# Patient Record
Sex: Male | Born: 1955 | Race: White | Hispanic: No | Marital: Married | Smoking: Former smoker
Health system: Southern US, Community
[De-identification: ages and names within clinical notes are randomized; demographics above are authoritative.]

## PROBLEM LIST (undated history)

## (undated) DIAGNOSIS — G43909 Migraine, unspecified, not intractable, without status migrainosus: Secondary | ICD-10-CM

## (undated) DIAGNOSIS — F32A Depression, unspecified: Secondary | ICD-10-CM

## (undated) DIAGNOSIS — J309 Allergic rhinitis, unspecified: Secondary | ICD-10-CM

## (undated) DIAGNOSIS — F419 Anxiety disorder, unspecified: Secondary | ICD-10-CM

## (undated) DIAGNOSIS — M25511 Pain in right shoulder: Secondary | ICD-10-CM

## (undated) DIAGNOSIS — IMO0002 Reserved for concepts with insufficient information to code with codable children: Secondary | ICD-10-CM

## (undated) DIAGNOSIS — I10 Essential (primary) hypertension: Secondary | ICD-10-CM

## (undated) DIAGNOSIS — G4730 Sleep apnea, unspecified: Secondary | ICD-10-CM

## (undated) DIAGNOSIS — E1165 Type 2 diabetes mellitus with hyperglycemia: Secondary | ICD-10-CM

## (undated) DIAGNOSIS — E7800 Pure hypercholesterolemia, unspecified: Secondary | ICD-10-CM

## (undated) DIAGNOSIS — G8929 Other chronic pain: Secondary | ICD-10-CM

## (undated) DIAGNOSIS — F329 Major depressive disorder, single episode, unspecified: Secondary | ICD-10-CM

## (undated) HISTORY — DX: Reserved for concepts with insufficient information to code with codable children: IMO0002

## (undated) HISTORY — PX: ROTATOR CUFF REPAIR: SHX139

## (undated) HISTORY — DX: Anxiety disorder, unspecified: F41.9

## (undated) HISTORY — DX: Morbid (severe) obesity due to excess calories: E66.01

## (undated) HISTORY — DX: Depression, unspecified: F32.A

## (undated) HISTORY — DX: Essential (primary) hypertension: I10

## (undated) HISTORY — DX: Type 2 diabetes mellitus with hyperglycemia: E11.65

## (undated) HISTORY — PX: APPENDECTOMY: SHX54

## (undated) HISTORY — DX: Pure hypercholesterolemia, unspecified: E78.00

## (undated) HISTORY — DX: Allergic rhinitis, unspecified: J30.9

## (undated) HISTORY — PX: TUMOR REMOVAL: SHX12

## (undated) HISTORY — DX: Other chronic pain: G89.29

## (undated) HISTORY — DX: Sleep apnea, unspecified: G47.30

## (undated) HISTORY — DX: Major depressive disorder, single episode, unspecified: F32.9

## (undated) HISTORY — DX: Pain in right shoulder: M25.511

## (undated) HISTORY — DX: Migraine, unspecified, not intractable, without status migrainosus: G43.909

---

## 2000-03-15 ENCOUNTER — Ambulatory Visit (HOSPITAL_COMMUNITY): Admission: RE | Admit: 2000-03-15 | Discharge: 2000-03-15 | Payer: Self-pay | Admitting: Internal Medicine

## 2000-10-23 ENCOUNTER — Emergency Department (HOSPITAL_COMMUNITY): Admission: EM | Admit: 2000-10-23 | Discharge: 2000-10-23 | Payer: Self-pay | Admitting: *Deleted

## 2000-10-23 ENCOUNTER — Encounter: Payer: Self-pay | Admitting: Emergency Medicine

## 2000-11-28 ENCOUNTER — Encounter: Admission: RE | Admit: 2000-11-28 | Discharge: 2000-11-28 | Payer: Self-pay | Admitting: Internal Medicine

## 2000-11-28 ENCOUNTER — Encounter: Payer: Self-pay | Admitting: Internal Medicine

## 2002-06-11 ENCOUNTER — Emergency Department (HOSPITAL_COMMUNITY): Admission: EM | Admit: 2002-06-11 | Discharge: 2002-06-12 | Payer: Self-pay | Admitting: Emergency Medicine

## 2003-07-29 ENCOUNTER — Ambulatory Visit (HOSPITAL_BASED_OUTPATIENT_CLINIC_OR_DEPARTMENT_OTHER): Admission: RE | Admit: 2003-07-29 | Discharge: 2003-07-29 | Payer: Self-pay | Admitting: Internal Medicine

## 2004-10-28 ENCOUNTER — Ambulatory Visit (HOSPITAL_COMMUNITY): Admission: RE | Admit: 2004-10-28 | Discharge: 2004-10-28 | Payer: Self-pay | Admitting: Internal Medicine

## 2004-11-18 ENCOUNTER — Ambulatory Visit (HOSPITAL_COMMUNITY): Admission: RE | Admit: 2004-11-18 | Discharge: 2004-11-19 | Payer: Self-pay | Admitting: Geriatric Medicine

## 2005-12-17 ENCOUNTER — Encounter: Admission: RE | Admit: 2005-12-17 | Discharge: 2005-12-17 | Payer: Self-pay | Admitting: Orthopedic Surgery

## 2006-01-18 ENCOUNTER — Ambulatory Visit (HOSPITAL_COMMUNITY): Admission: RE | Admit: 2006-01-18 | Discharge: 2006-01-19 | Payer: Self-pay | Admitting: Orthopedic Surgery

## 2011-07-21 ENCOUNTER — Other Ambulatory Visit: Payer: Self-pay | Admitting: Physician Assistant

## 2011-07-21 ENCOUNTER — Ambulatory Visit
Admission: RE | Admit: 2011-07-21 | Discharge: 2011-07-21 | Disposition: A | Discharge status: Home / Self Care | Payer: BC Managed Care – PPO | Source: Ambulatory Visit | Attending: Physician Assistant | Admitting: Physician Assistant

## 2011-07-21 DIAGNOSIS — R509 Fever, unspecified: Secondary | ICD-10-CM

## 2012-02-18 ENCOUNTER — Other Ambulatory Visit: Payer: Self-pay | Admitting: Family Medicine

## 2012-02-18 ENCOUNTER — Ambulatory Visit
Admission: RE | Admit: 2012-02-18 | Discharge: 2012-02-18 | Disposition: A | Discharge status: Home / Self Care | Payer: BC Managed Care – PPO | Source: Ambulatory Visit | Attending: Family Medicine | Admitting: Family Medicine

## 2012-02-18 DIAGNOSIS — M069 Rheumatoid arthritis, unspecified: Secondary | ICD-10-CM

## 2012-02-18 DIAGNOSIS — R52 Pain, unspecified: Secondary | ICD-10-CM

## 2012-02-18 DIAGNOSIS — D464 Refractory anemia, unspecified: Secondary | ICD-10-CM

## 2014-08-12 ENCOUNTER — Ambulatory Visit: Payer: BC Managed Care – PPO | Admitting: Endocrinology

## 2014-08-12 ENCOUNTER — Encounter: Payer: Self-pay | Admitting: *Deleted

## 2016-11-15 ENCOUNTER — Encounter: Payer: Self-pay | Admitting: Endocrinology

## 2017-12-30 ENCOUNTER — Encounter: Payer: Self-pay | Admitting: Endocrinology

## 2017-12-30 ENCOUNTER — Ambulatory Visit: Payer: BC Managed Care – PPO | Admitting: Endocrinology

## 2017-12-30 VITALS — BP 160/100 | HR 97 | Ht 65.0 in | Wt 287.0 lb

## 2017-12-30 DIAGNOSIS — E1165 Type 2 diabetes mellitus with hyperglycemia: Secondary | ICD-10-CM

## 2017-12-30 LAB — POCT GLYCOSYLATED HEMOGLOBIN (HGB A1C): Hemoglobin A1C: 8.9 % — AB (ref 4.0–5.6)

## 2017-12-30 MED ORDER — SITAGLIPTIN PHOSPHATE 100 MG PO TABS: 100.0000 mg | ORAL_TABLET | Freq: Every day | ORAL | 11 refills | Status: DC

## 2017-12-30 MED ORDER — INSULIN DEGLUDEC 200 UNIT/ML ~~LOC~~ SOPN: 180.0000 [IU] | PEN_INJECTOR | Freq: Every day | SUBCUTANEOUS | 11 refills | Status: DC

## 2017-12-30 NOTE — Progress Notes (Signed)
Subjective:    Patient ID: Brian Sweeney, male    DOB: 02-08-1956, 62 y.o.   MRN: [id number]  HPI pt is referred by Dr Jerrol Banana diabetes.  Pt states DM was dx'ed in 2015; he has mild neuropathy of the lower extremities, and associated PDR; he has been on insulin since late 2018; pt says his diet and exercise are poor; he has never had pancreatitis, pancreatic surgery, severe hypoglycemia or DKA.  He takes tresiba, 220 units/d.  He says cbg's vary from 70-280.   Meter is downloaded today, and the printout is scanned into the record.  It varies from 90-280.  There is no trend throughout the day. Past Medical History:  Diagnosis Date  . Allergic rhinitis   . Anxiety   . Chronic right shoulder pain   . Depression   . Hypercholesterolemia   . Hypertension   . Migraine   . Obesity, morbid ([place])   . Sleep apnea   . Type II diabetes mellitus, uncontrolled ([place])     Past Surgical History:  Procedure Laterality Date  . APPENDECTOMY    . ROTATOR CUFF REPAIR Right   . TUMOR REMOVAL      Social History   Socioeconomic History  . Marital status: Married    Spouse name: Not on file  . Number of children: Not on file  . Years of education: Not on file  . Highest education level: Not on file  Occupational History  . Not on file  Social Needs  . Financial resource strain: Not on file  . Food insecurity:    Worry: Not on file    Inability: Not on file  . Transportation needs:    Medical: Not on file    Non-medical: Not on file  Tobacco Use  . Smoking status: Former Research scientist (life sciences)  . Smokeless tobacco: Never Used  Substance and Sexual Activity  . Alcohol use: Not Currently    Frequency: Never  . Drug use: Not Currently  . Sexual activity: Not on file  Lifestyle  . Physical activity:    Days per week: Not on file    Minutes per session: Not on file  . Stress: Not on file  Relationships  . Social connections:    Talks on phone: Not on file    Gets together: Not on file    Attends  religious service: Not on file    Active member of club or organization: Not on file    Attends meetings of clubs or organizations: Not on file    Relationship status: Not on file  . Intimate partner violence:    Fear of current or ex partner: Not on file    Emotionally abused: Not on file    Physically abused: Not on file    Forced sexual activity: Not on file  Other Topics Concern  . Not on file  Social History Narrative  . Not on file    Current Outpatient Medications on File Prior to Visit  Medication Sig Dispense Refill  . cetirizine (ZYRTEC) 10 MG tablet Take 10 mg by mouth daily.    Marland Kitchen esomeprazole (NEXIUM) 20 MG capsule Take 20 mg by mouth daily at 12 noon.    . furosemide (LASIX) 40 MG tablet     . Olmesartan-amLODIPine-HCTZ (TRIBENZOR) 40-10-25 MG TABS daily.     No current facility-administered medications on file prior to visit.     Allergies  Allergen Reactions  . Invokana [Canagliflozin]   . Metformin And  Related Diarrhea  . Trulicity [Dulaglutide]   . Bydureon [Exenatide] Rash  . Tanzeum [Albiglutide] Rash    Family History  Problem Relation Age of Onset  . Diabetes Mother     BP (!) 160/100 (BP Location: Left Arm, Patient Position: Sitting, Cuff Size: Normal)   Pulse 97   Ht 5\' 5"  (1.651 m)   Wt 287 lb (130.2 kg)   SpO2 95%   BMI 47.76 kg/m     Review of Systems denies weight loss, blurry vision, headache, chest pain, sob, n/v, excessive diaphoresis, memory loss, cold intolerance, rhinorrhea, and easy bruising.  He has insomnia, leg cramps, and fatigue.  He attributes urinary frequency to lasix    Objective:   Physical Exam VS: see vs page GEN: no distress HEAD: head: no deformity eyes: no periorbital swelling, no proptosis external nose and ears are normal mouth: no lesion seen NECK: supple, thyroid is not enlarged CHEST WALL: no deformity LUNGS: clear to auscultation CV: reg rate and rhythm, no murmur ABD: abdomen is soft, nontender.   no hepatosplenomegaly.  not distended.  Self-reducing ventral hernia MUSCULOSKELETAL: muscle bulk and strength are grossly normal.  no obvious joint swelling.  gait is normal and steady EXTEMITIES: no deformity.  no ulcer on the feet.  feet are of normal color and temp.  2+ bilat leg edema, and bilat vv's. There is bilateral onychomycosis of the toenails PULSES: dorsalis pedis intact bilat.  no carotid bruit NEURO:  cn 2-12 grossly intact.   readily moves all 4's.  sensation is intact to touch on the feet SKIN:  Normal texture and temperature.  No rash or suspicious lesion is visible.   NODES:  None palpable at the neck PSYCH: alert, well-oriented.  Does not appear anxious nor depressed.   Lab Results  Component Value Date   HGBA1C 8.9 (A) 12/30/2017   I have reviewed outside records, and summarized: Pt was noted to have elevated a1c, and referred here.  He did not tolerate metformin (diarrhea), invokana (diarrhea), or trulicity (hypoglycemia).        Assessment & Plan:  Insulin-requiring type 2 DM, with PDR:  Obesity, new to me: pt requests for this to be considered in her DM regimen.    Patient Instructions  good diet and exercise significantly improve the control of your diabetes.  please let me know if you wish to be referred to a dietician.  high blood sugar is very risky to your health.  you should see an eye doctor and dentist every year.  It is very important to get all recommended vaccinations.   Controlling your blood pressure and cholesterol drastically reduces the damage diabetes does to your body.  Those who smoke should quit.  Please discuss these with your doctor.   check your blood sugar twice a day.  vary the time of day when you check, between before the 3 meals, and at bedtime.  also check if you have symptoms of your blood sugar being too high or too low.  please keep a record of the readings and bring it to your next appointment here (or you can bring the meter itself).   You can write it on any piece of paper.  please call us sooner if your blood sugar goes below 70, or if you have a lot of readings over 200.  We will need to take this complex situation in stages.   For now, please: Start januvia, 100 mg daily, and: reduce the tresiba to 180  mg daily Please call or message [place] next week, to tell us how the blood sugar is doing. If necessary, we can add "farxiga."  Please come back for a follow-up appointment in 1 month.    Bariatric Surgery You have so much to gain by losing weight.  You may have already tried every diet and exercise plan imaginable.  And, you may have sought advice from your family physician, too.   Sometimes, in spite of such diligent efforts, you may not be able to achieve long-term results by yourself.  In cases of severe obesity, bariatric or weight loss surgery is a proven method of achieving long-term weight control.  Our Services Our bariatric surgery programs offer our patients new hope and long-term weight-loss solution.  Since introducing our services in 2003, we have conducted more than 2,400 successful procedures.  Our program is designated as a Programmer, multimedia by the Metabolic and Bariatric Surgery Accreditation and Quality Improvement Program (MBSAQIP), a IT trainer that sets rigorous patient safety and outcome standards.  Our program is also designated as a Ecologist by [employer].   Our exceptional weight-loss surgery team specializes in diagnosis, treatment, follow-up care, and ongoing support for our patients with severe weight loss challenges.  We currently offer laparoscopic sleeve gastrectomy, gastric bypass, and adjustable gastric band (LAP-BAND).    Attend our [place] Choosing to undergo a bariatric procedure is a big decision, and one that should not be taken lightly.  You now have two options in how you learn about weight-loss surgery - in person or online.  Our  objective is to ensure you have all of the information that you need to evaluate the advantages and obligations of this life changing procedure.  Please note that you are not alone in this process, and our experienced team is ready to assist and answer all of your questions.  There are several ways to register for a seminar (either on-line or in person): 1)  Call [phone] 2) Go on-line to [facility] and register for either type of seminar.  [email or web address]

## 2017-12-30 NOTE — Patient Instructions (Addendum)
good diet and exercise significantly improve the control of your diabetes.  please let me know if you wish to be referred to a dietician.  high blood sugar is very risky to your health.  you should see an eye doctor and dentist every year.  It is very important to get all recommended vaccinations.   Controlling your blood pressure and cholesterol drastically reduces the damage diabetes does to your body.  Those who smoke should quit.  Please discuss these with your doctor.   check your blood sugar twice a day.  vary the time of day when you check, between before the 3 meals, and at bedtime.  also check if you have symptoms of your blood sugar being too high or too low.  please keep a record of the readings and bring it to your next appointment here (or you can bring the meter itself).  You can write it on any piece of paper.  please call us sooner if your blood sugar goes below 70, or if you have a lot of readings over 200.  We will need to take this complex situation in stages.   For now, please: Start januvia, 100 mg daily, and: reduce the tresiba to 180 mg daily Please call or message [place] next week, to tell us how the blood sugar is doing. If necessary, we can add "farxiga."  Please come back for a follow-up appointment in 1 month.    Bariatric Surgery You have so much to gain by losing weight.  You may have already tried every diet and exercise plan imaginable.  And, you may have sought advice from your family physician, too.   Sometimes, in spite of such diligent efforts, you may not be able to achieve long-term results by yourself.  In cases of severe obesity, bariatric or weight loss surgery is a proven method of achieving long-term weight control.  Our Services Our bariatric surgery programs offer our patients new hope and long-term weight-loss solution.  Since introducing our services in 2003, we have conducted more than 2,400 successful procedures.  Our program is designated as a  Programmer, multimedia by the Metabolic and Bariatric Surgery Accreditation and Quality Improvement Program (MBSAQIP), a IT trainer that sets rigorous patient safety and outcome standards.  Our program is also designated as a Ecologist by [employer].   Our exceptional weight-loss surgery team specializes in diagnosis, treatment, follow-up care, and ongoing support for our patients with severe weight loss challenges.  We currently offer laparoscopic sleeve gastrectomy, gastric bypass, and adjustable gastric band (LAP-BAND).    Attend our [place] Choosing to undergo a bariatric procedure is a big decision, and one that should not be taken lightly.  You now have two options in how you learn about weight-loss surgery - in person or online.  Our objective is to ensure you have all of the information that you need to evaluate the advantages and obligations of this life changing procedure.  Please note that you are not alone in this process, and our experienced team is ready to assist and answer all of your questions.  There are several ways to register for a seminar (either on-line or in person): 1)  Call [phone] 2) Go on-line to [facility] and register for either type of seminar.  [email or web address]

## 2018-01-10 ENCOUNTER — Encounter: Payer: Self-pay | Admitting: Endocrinology

## 2018-01-12 ENCOUNTER — Other Ambulatory Visit: Payer: Self-pay | Admitting: Endocrinology

## 2018-01-12 MED ORDER — DAPAGLIFLOZIN PROPANEDIOL 10 MG PO TABS: 10.0000 mg | ORAL_TABLET | Freq: Every day | ORAL | 11 refills | Status: DC

## 2018-01-26 ENCOUNTER — Encounter: Payer: Self-pay | Admitting: Endocrinology

## 2018-02-01 ENCOUNTER — Other Ambulatory Visit: Payer: Self-pay | Admitting: Endocrinology

## 2018-02-01 ENCOUNTER — Encounter: Payer: Self-pay | Admitting: Endocrinology

## 2018-02-01 ENCOUNTER — Ambulatory Visit: Payer: BC Managed Care – PPO | Admitting: Endocrinology

## 2018-02-01 DIAGNOSIS — E119 Type 2 diabetes mellitus without complications: Secondary | ICD-10-CM | POA: Insufficient documentation

## 2018-02-01 DIAGNOSIS — Z794 Long term (current) use of insulin: Secondary | ICD-10-CM | POA: Diagnosis not present

## 2018-02-01 DIAGNOSIS — E113599 Type 2 diabetes mellitus with proliferative diabetic retinopathy without macular edema, unspecified eye: Secondary | ICD-10-CM

## 2018-02-01 DIAGNOSIS — E042 Nontoxic multinodular goiter: Secondary | ICD-10-CM | POA: Insufficient documentation

## 2018-02-01 LAB — BASIC METABOLIC PANEL
BUN: 19 mg/dL (ref 6–23)
CHLORIDE: 99 meq/L (ref 96–112)
CO2: 31 mEq/L (ref 19–32)
Calcium: 9.2 mg/dL (ref 8.4–10.5)
Creatinine, Ser: 1.04 mg/dL (ref 0.40–1.50)
GFR: 77.33 mL/min (ref >60.00)
GLUCOSE: 85 mg/dL (ref 70–99)
POTASSIUM: 2.9 meq/L — AB (ref 3.5–5.1)
Sodium: 141 mEq/L (ref 135–145)

## 2018-02-01 NOTE — Patient Instructions (Addendum)
check your blood sugar twice a day.  vary the time of day when you check, between before the 3 meals, and at bedtime.  also check if you have symptoms of your blood sugar being too high or too low.  please keep a record of the readings and bring it to your next appointment here (or you can bring the meter itself).  You can write it on any piece of paper.  please call us sooner if your blood sugar goes below 70, or if you have a lot of readings over 200.  We will need to take this complex situation in stages.   blood tests are requested for you today.  We'll let you know about the results.  Based on the results, we'll adjust the insulin.   Please come back for a follow-up appointment in 6 weeks.

## 2018-02-01 NOTE — Progress Notes (Signed)
Subjective:    Patient ID: Brian Sweeney, male    DOB: 16-Jan-1956, 62 y.o.   MRN: [id number]  HPI Pt returns for f/u of diabetes mellitus: DM type: Insulin-requiring type 2 Dx'ed: 7510 Complications: polyneuropathy and PDR Therapy: insulin since 2018 DKA: never Severe hypoglycemia: never Pancreatitis: never Pancreatic imaging: never Other: he did not tolerate metformin (diarrhea), or Trulicity (rash); he declines multiple daily injections Interval history: He recently had an episodes of dizziness, which responded to oral glucose.  This happened with activity.  no cbg record, but states cbg's vary from 100-160.  It is in general higher as the day goes on, but not necessary so.   Past Medical History:  Diagnosis Date  . Allergic rhinitis   . Anxiety   . Chronic right shoulder pain   . Depression   . Hypercholesterolemia   . Hypertension   . Migraine   . Obesity, morbid ([place])   . Sleep apnea   . Type II diabetes mellitus, uncontrolled ([place])     Past Surgical History:  Procedure Laterality Date  . APPENDECTOMY    . ROTATOR CUFF REPAIR Right   . TUMOR REMOVAL      Social History   Socioeconomic History  . Marital status: Married    Spouse name: Not on file  . Number of children: Not on file  . Years of education: Not on file  . Highest education level: Not on file  Occupational History  . Not on file  Social Needs  . Financial resource strain: Not on file  . Food insecurity:    Worry: Not on file    Inability: Not on file  . Transportation needs:    Medical: Not on file    Non-medical: Not on file  Tobacco Use  . Smoking status: Former Research scientist (life sciences)  . Smokeless tobacco: Never Used  Substance and Sexual Activity  . Alcohol use: Not Currently    Frequency: Never  . Drug use: Not Currently  . Sexual activity: Not on file  Lifestyle  . Physical activity:    Days per week: Not on file    Minutes per session: Not on file  . Stress: Not on file  Relationships  .  Social connections:    Talks on phone: Not on file    Gets together: Not on file    Attends religious service: Not on file    Active member of club or organization: Not on file    Attends meetings of clubs or organizations: Not on file    Relationship status: Not on file  . Intimate partner violence:    Fear of current or ex partner: Not on file    Emotionally abused: Not on file    Physically abused: Not on file    Forced sexual activity: Not on file  Other Topics Concern  . Not on file  Social History Narrative  . Not on file    Current Outpatient Medications on File Prior to Visit  Medication Sig Dispense Refill  . cetirizine (ZYRTEC) 10 MG tablet Take 10 mg by mouth daily.    . dapagliflozin propanediol (FARXIGA) 10 MG TABS tablet Take 10 mg by mouth daily. 30 tablet 11  . esomeprazole (NEXIUM) 20 MG capsule Take 20 mg by mouth daily at 12 noon.    . furosemide (LASIX) 40 MG tablet     . Insulin Degludec (TRESIBA FLEXTOUCH) 200 UNIT/ML SOPN Inject 180 Units into the skin daily. 9 pen 11  .  Olmesartan-amLODIPine-HCTZ (TRIBENZOR) 40-10-25 MG TABS daily.    . sitaGLIPtin (JANUVIA) 100 MG tablet Take 1 tablet (100 mg total) by mouth daily. 30 tablet 11   No current facility-administered medications on file prior to visit.     Allergies  Allergen Reactions  . Invokana [Canagliflozin]   . Metformin And Related Diarrhea  . Trulicity [Dulaglutide]   . Bydureon [Exenatide] Rash  . Tanzeum [Albiglutide] Rash    Family History  Problem Relation Age of Onset  . Diabetes Mother     BP 124/64 (BP Location: Left Arm, Patient Position: Sitting, Cuff Size: Normal)   Pulse 85   Ht 5\' 5"  (1.651 m)   Wt 286 lb 3.2 oz (129.8 kg)   SpO2 97%   BMI 47.63 kg/m   Review of Systems Denies LOC    Objective:   Physical Exam VITAL SIGNS:  See vs page GENERAL: no distress Pulses: dorsalis pedis intact bilat.   MSK: no deformity of the feet CV: no leg edema Skin:  no ulcer on the  feet.  normal color and temp on the feet. Neuro: sensation is intact to touch on the feet      Assessment & Plan:  Insulin-requiring type 2 DM, with PDR: poss overcontrolled Dizziness, new. Poss due to hypoglycemia  Patient Instructions  check your blood sugar twice a day.  vary the time of day when you check, between before the 3 meals, and at bedtime.  also check if you have symptoms of your blood sugar being too high or too low.  please keep a record of the readings and bring it to your next appointment here (or you can bring the meter itself).  You can write it on any piece of paper.  please call us sooner if your blood sugar goes below 70, or if you have a lot of readings over 200.  We will need to take this complex situation in stages.   blood tests are requested for you today.  We'll let you know about the results.  Based on the results, we'll adjust the insulin.   Please come back for a follow-up appointment in 6 weeks.

## 2018-02-03 LAB — FRUCTOSAMINE: Fructosamine: 256 umol/L (ref 190–270)

## 2018-02-10 ENCOUNTER — Telehealth: Payer: Self-pay | Admitting: Endocrinology

## 2018-02-10 NOTE — Telephone Encounter (Signed)
Kenney Houseman with [facility] ph# [phone] called re: Dr. Loanne Drilling wanted a thyroid biopsy but they do not see that an ultrasound was done in the last year unless it was done elsewhere. If the ultrasound was done they need the dicom CD and report. Please call them and let them know if there has been an ultrasound or not.

## 2018-02-13 NOTE — Telephone Encounter (Signed)
Please advise 

## 2018-02-13 NOTE — Telephone Encounter (Signed)
Please cancel, as this appears to be an error

## 2018-02-13 NOTE — Telephone Encounter (Signed)
Order cancelled

## 2018-03-29 ENCOUNTER — Encounter: Payer: Self-pay | Admitting: Endocrinology

## 2018-03-29 ENCOUNTER — Ambulatory Visit: Payer: BC Managed Care – PPO | Admitting: Endocrinology

## 2018-03-29 VITALS — BP 132/80 | HR 85 | Ht 65.0 in | Wt 286.8 lb

## 2018-03-29 DIAGNOSIS — Z794 Long term (current) use of insulin: Secondary | ICD-10-CM

## 2018-03-29 DIAGNOSIS — E113599 Type 2 diabetes mellitus with proliferative diabetic retinopathy without macular edema, unspecified eye: Secondary | ICD-10-CM

## 2018-03-29 LAB — POCT GLYCOSYLATED HEMOGLOBIN (HGB A1C): HEMOGLOBIN A1C: 7.4 % — AB (ref 4.0–5.6)

## 2018-03-29 NOTE — Progress Notes (Signed)
Subjective:    Patient ID: Brian Sweeney, male    DOB: 12/16/1955, 62 y.o.   MRN: [id number]  HPI Pt returns for f/u of diabetes mellitus: DM type: Insulin-requiring type 2 Dx'ed: 5809 Complications: polyneuropathy and PDR Therapy: insulin since 2018, and 2 oral meds DKA: never Severe hypoglycemia: never Pancreatitis: never Pancreatic imaging: never Other: he did not tolerate metformin (diarrhea), or Trulicity (rash); he declines multiple daily injections Interval history: Meter is downloaded today, and the printout is scanned into the record. cbg's vary from 75-180.  There is no trend throughout the day.  pt states he feels well in general, except for intermittent dizziness.   Past Medical History:  Diagnosis Date  . Allergic rhinitis   . Anxiety   . Chronic right shoulder pain   . Depression   . Hypercholesterolemia   . Hypertension   . Migraine   . Obesity, morbid ([place])   . Sleep apnea   . Type II diabetes mellitus, uncontrolled ([place])     Past Surgical History:  Procedure Laterality Date  . APPENDECTOMY    . ROTATOR CUFF REPAIR Right   . TUMOR REMOVAL      Social History   Socioeconomic History  . Marital status: Married    Spouse name: Not on file  . Number of children: Not on file  . Years of education: Not on file  . Highest education level: Not on file  Occupational History  . Not on file  Social Needs  . Financial resource strain: Not on file  . Food insecurity:    Worry: Not on file    Inability: Not on file  . Transportation needs:    Medical: Not on file    Non-medical: Not on file  Tobacco Use  . Smoking status: Former Research scientist (life sciences)  . Smokeless tobacco: Never Used  Substance and Sexual Activity  . Alcohol use: Not Currently    Frequency: Never  . Drug use: Not Currently  . Sexual activity: Not on file  Lifestyle  . Physical activity:    Days per week: Not on file    Minutes per session: Not on file  . Stress: Not on file  Relationships  .  Social connections:    Talks on phone: Not on file    Gets together: Not on file    Attends religious service: Not on file    Active member of club or organization: Not on file    Attends meetings of clubs or organizations: Not on file    Relationship status: Not on file  . Intimate partner violence:    Fear of current or ex partner: Not on file    Emotionally abused: Not on file    Physically abused: Not on file    Forced sexual activity: Not on file  Other Topics Concern  . Not on file  Social History Narrative  . Not on file    Current Outpatient Medications on File Prior to Visit  Medication Sig Dispense Refill  . cetirizine (ZYRTEC) 10 MG tablet Take 10 mg by mouth daily.    . dapagliflozin propanediol (FARXIGA) 10 MG TABS tablet Take 10 mg by mouth daily. 30 tablet 11  . esomeprazole (NEXIUM) 20 MG capsule Take 20 mg by mouth daily at 12 noon.    . furosemide (LASIX) 40 MG tablet     . Insulin Degludec (TRESIBA FLEXTOUCH) 200 UNIT/ML SOPN Inject 180 Units into the skin daily. 9 pen 11  . Olmesartan-amLODIPine-HCTZ (  TRIBENZOR) 40-10-25 MG TABS daily.    . sitaGLIPtin (JANUVIA) 100 MG tablet Take 1 tablet (100 mg total) by mouth daily. 30 tablet 11  . BD PEN NEEDLE NANO U/F 32G X 4 MM MISC     . ONETOUCH VERIO test strip     . potassium chloride SA (K-DUR,KLOR-CON) 20 MEQ tablet      No current facility-administered medications on file prior to visit.     Allergies  Allergen Reactions  . Invokana [Canagliflozin]   . Metformin And Related Diarrhea  . Trulicity [Dulaglutide]   . Bydureon [Exenatide] Rash  . Tanzeum [Albiglutide] Rash    Family History  Problem Relation Age of Onset  . Diabetes Mother     BP 132/80 (BP Location: Right Arm, Patient Position: Sitting, Cuff Size: Normal)   Pulse 85   Ht 5\' 5"  (1.651 m)   Wt 286 lb 12.8 oz (130.1 kg)   SpO2 97%   BMI 47.73 kg/m   Review of Systems He denies hypoglycemia    Objective:   Physical Exam VITAL  SIGNS:  See vs page GENERAL: no distress Pulses: foot pulses are intact bilaterally.   MSK: no deformity of the feet or ankles.  CV: no edema of the legs or ankles Skin:  no ulcer on the feet or ankles.  normal color and temp on the feet and ankles Neuro: sensation is intact to touch on the feet and ankles.    Lab Results  Component Value Date   HGBA1C 7.4 (A) 03/29/2018       Assessment & Plan:  Insulin-requiring type 2 DM, with PDR: this is the best control this pt should aim for, given this regimen, which does match insulin to his changing needs throughout the day  Patient Instructions  check your blood sugar twice a day.  vary the time of day when you check, between before the 3 meals, and at bedtime.  also check if you have symptoms of your blood sugar being too high or too low.  please keep a record of the readings and bring it to your next appointment here (or you can bring the meter itself).  You can write it on any piece of paper.  please call us sooner if your blood sugar goes below 70, or if you have a lot of readings over 200.  Please continue the same medications for dabetes.   Please come back for a follow-up appointment in 3 months.

## 2018-03-29 NOTE — Patient Instructions (Addendum)
check your blood sugar twice a day.  vary the time of day when you check, between before the 3 meals, and at bedtime.  also check if you have symptoms of your blood sugar being too high or too low.  please keep a record of the readings and bring it to your next appointment here (or you can bring the meter itself).  You can write it on any piece of paper.  please call us sooner if your blood sugar goes below 70, or if you have a lot of readings over 200.  Please continue the same medications for dabetes.   Please come back for a follow-up appointment in 3 months.

## 2018-04-18 ENCOUNTER — Encounter: Payer: Self-pay | Admitting: Endocrinology

## 2018-04-19 ENCOUNTER — Other Ambulatory Visit: Payer: Self-pay

## 2018-04-19 MED ORDER — BD PEN NEEDLE NANO U/F 32G X 4 MM MISC: 1.0000 | Freq: Two times a day (BID) | 11 refills | Status: DC

## 2018-07-03 ENCOUNTER — Encounter: Payer: Self-pay | Admitting: Endocrinology

## 2018-07-03 ENCOUNTER — Ambulatory Visit: Payer: BC Managed Care – PPO | Admitting: Endocrinology

## 2018-07-03 VITALS — BP 132/84 | HR 74 | Ht 65.0 in | Wt 285.7 lb

## 2018-07-03 DIAGNOSIS — Z794 Long term (current) use of insulin: Secondary | ICD-10-CM | POA: Diagnosis not present

## 2018-07-03 DIAGNOSIS — E113599 Type 2 diabetes mellitus with proliferative diabetic retinopathy without macular edema, unspecified eye: Secondary | ICD-10-CM | POA: Diagnosis not present

## 2018-07-03 LAB — POCT GLYCOSYLATED HEMOGLOBIN (HGB A1C): HEMOGLOBIN A1C: 7.6 % — AB (ref 4.0–5.6)

## 2018-07-03 NOTE — Patient Instructions (Addendum)
check your blood sugar twice a day.  vary the time of day when you check, between before the 3 meals, and at bedtime.  also check if you have symptoms of your blood sugar being too high or too low.  please keep a record of the readings and bring it to your next appointment here (or you can bring the meter itself).  You can write it on any piece of paper.  please call us sooner if your blood sugar goes below 70, or if you have a lot of readings over 200.  Please continue the same medications for diabetes.  On this type of insulin schedule, you should eat meals on a regular schedule.  If a meal is missed or significantly delayed, your blood sugar could go low.  Please come back for a follow-up appointment in 3 months.     Bariatric Surgery You have so much to gain by losing weight.  You may have already tried every diet and exercise plan imaginable.  And, you may have sought advice from your family physician, too.   Sometimes, in spite of such diligent efforts, you may not be able to achieve long-term results by yourself.  In cases of severe obesity, bariatric or weight loss surgery is a proven method of achieving long-term weight control.  Our Services Our bariatric surgery programs offer our patients new hope and long-term weight-loss solution.  Since introducing our services in 2003, we have conducted more than 2,400 successful procedures.  Our program is designated as a Programmer, multimedia by the Metabolic and Bariatric Surgery Accreditation and Quality Improvement Program (MBSAQIP), a IT trainer that sets rigorous patient safety and outcome standards.  Our program is also designated as a Ecologist by [employer].   Our exceptional weight-loss surgery team specializes in diagnosis, treatment, follow-up care, and ongoing support for our patients with severe weight loss challenges.  We currently offer laparoscopic sleeve gastrectomy, gastric bypass, and adjustable  gastric band (LAP-BAND).    Attend our [place] Choosing to undergo a bariatric procedure is a big decision, and one that should not be taken lightly.  You now have two options in how you learn about weight-loss surgery - in person or online.  Our objective is to ensure you have all of the information that you need to evaluate the advantages and obligations of this life changing procedure.  Please note that you are not alone in this process, and our experienced team is ready to assist and answer all of your questions.  There are several ways to register for a seminar (either on-line or in person): 1)  Call [phone] 2) Go on-line to [facility] and register for either type of seminar.  [email or web address]

## 2018-07-03 NOTE — Progress Notes (Signed)
Subjective:    Patient ID: Brian Sweeney, male    DOB: 01/25/1956, 62 y.o.   MRN: [id number]  HPI Pt returns for f/u of diabetes mellitus: DM type: Insulin-requiring type 2 Dx'ed: 1829 Complications: polyneuropathy and PDR Therapy: insulin since 2018, and 2 oral meds DKA: never Severe hypoglycemia: never Pancreatitis: never Pancreatic imaging: never Other: he did not tolerate metformin (diarrhea), or Trulicity (rash); he declines multiple daily injections.   Interval history: Meter is downloaded today, and the printout is scanned into the record. cbg's vary from 120-200.  All are checked fasting  pt states he feels well in general.   Past Medical History:  Diagnosis Date  . Allergic rhinitis   . Anxiety   . Chronic right shoulder pain   . Depression   . Hypercholesterolemia   . Hypertension   . Migraine   . Obesity, morbid ([place])   . Sleep apnea   . Type II diabetes mellitus, uncontrolled ([place])     Past Surgical History:  Procedure Laterality Date  . APPENDECTOMY    . ROTATOR CUFF REPAIR Right   . TUMOR REMOVAL      Social History   Socioeconomic History  . Marital status: Married    Spouse name: Not on file  . Number of children: Not on file  . Years of education: Not on file  . Highest education level: Not on file  Occupational History  . Not on file  Social Needs  . Financial resource strain: Not on file  . Food insecurity:    Worry: Not on file    Inability: Not on file  . Transportation needs:    Medical: Not on file    Non-medical: Not on file  Tobacco Use  . Smoking status: Former Research scientist (life sciences)  . Smokeless tobacco: Never Used  Substance and Sexual Activity  . Alcohol use: Not Currently    Frequency: Never  . Drug use: Not Currently  . Sexual activity: Not on file  Lifestyle  . Physical activity:    Days per week: Not on file    Minutes per session: Not on file  . Stress: Not on file  Relationships  . Social connections:    Talks on phone: Not on  file    Gets together: Not on file    Attends religious service: Not on file    Active member of club or organization: Not on file    Attends meetings of clubs or organizations: Not on file    Relationship status: Not on file  . Intimate partner violence:    Fear of current or ex partner: Not on file    Emotionally abused: Not on file    Physically abused: Not on file    Forced sexual activity: Not on file  Other Topics Concern  . Not on file  Social History Narrative  . Not on file    Current Outpatient Medications on File Prior to Visit  Medication Sig Dispense Refill  . BD PEN NEEDLE NANO U/F 32G X 4 MM MISC Inject 1 Syringe into the skin 2 (two) times daily. 100 each 11  . cetirizine (ZYRTEC) 10 MG tablet Take 10 mg by mouth daily.    . dapagliflozin propanediol (FARXIGA) 10 MG TABS tablet Take 10 mg by mouth daily. 30 tablet 11  . esomeprazole (NEXIUM) 20 MG capsule Take 20 mg by mouth daily at 12 noon.    . furosemide (LASIX) 40 MG tablet     .  Insulin Degludec (TRESIBA FLEXTOUCH) 200 UNIT/ML SOPN Inject 180 Units into the skin daily. 9 pen 11  . Olmesartan-amLODIPine-HCTZ (TRIBENZOR) 40-10-25 MG TABS daily.    Glory Rosebush VERIO test strip     . potassium chloride SA (K-DUR,KLOR-CON) 20 MEQ tablet     . sitaGLIPtin (JANUVIA) 100 MG tablet Take 1 tablet (100 mg total) by mouth daily. 30 tablet 11   No current facility-administered medications on file prior to visit.     Allergies  Allergen Reactions  . Invokana [Canagliflozin]   . Metformin And Related Diarrhea  . Trulicity [Dulaglutide]   . Bydureon [Exenatide] Rash  . Tanzeum [Albiglutide] Rash    Family History  Problem Relation Age of Onset  . Diabetes Mother     BP 132/84 (BP Location: Left Arm, Patient Position: Sitting, Cuff Size: Normal)   Pulse 74   Ht 5\' 5"  (1.651 m)   Wt 285 lb 11.2 oz (129.6 kg)   SpO2 96%   BMI 47.54 kg/m    Review of Systems He denies hypoglycemia    Objective:   Physical  Exam VITAL SIGNS:  See vs page GENERAL: no distress Pulses: foot pulses are intact bilaterally.   MSK: no deformity of the feet or ankles.  CV: trace bilat edema of the legs, and bilat vv's.   Skin:  no ulcer on the feet or ankles.  normal color and temp on the feet and ankles Neuro: sensation is intact to touch on the feet and ankles.     Lab Results  Component Value Date   CREATININE 1.04 02/01/2018   BUN 19 02/01/2018   NA 141 02/01/2018   K 2.9 (L) 02/01/2018   CL 99 02/01/2018   CO2 31 02/01/2018   Lab Results  Component Value Date   HGBA1C 7.6 (A) 07/03/2018       Assessment & Plan:  Insulin-requiring type 2 DM, with PDR: this is the best control this pt should aim for, given this regimen, which does match insulin to his changing needs throughout the day Obesity: persistent.   Patient Instructions  check your blood sugar twice a day.  vary the time of day when you check, between before the 3 meals, and at bedtime.  also check if you have symptoms of your blood sugar being too high or too low.  please keep a record of the readings and bring it to your next appointment here (or you can bring the meter itself).  You can write it on any piece of paper.  please call us sooner if your blood sugar goes below 70, or if you have a lot of readings over 200.  Please continue the same medications for diabetes.  On this type of insulin schedule, you should eat meals on a regular schedule.  If a meal is missed or significantly delayed, your blood sugar could go low.  Please come back for a follow-up appointment in 3 months.     Bariatric Surgery You have so much to gain by losing weight.  You may have already tried every diet and exercise plan imaginable.  And, you may have sought advice from your family physician, too.   Sometimes, in spite of such diligent efforts, you may not be able to achieve long-term results by yourself.  In cases of severe obesity, bariatric or weight loss  surgery is a proven method of achieving long-term weight control.  Our Services Our bariatric surgery programs offer our patients new hope and long-term weight-loss  solution.  Since introducing our services in 2003, we have conducted more than 2,400 successful procedures.  Our program is designated as a Programmer, multimedia by the Metabolic and Bariatric Surgery Accreditation and Quality Improvement Program (MBSAQIP), a IT trainer that sets rigorous patient safety and outcome standards.  Our program is also designated as a Ecologist by [employer].   Our exceptional weight-loss surgery team specializes in diagnosis, treatment, follow-up care, and ongoing support for our patients with severe weight loss challenges.  We currently offer laparoscopic sleeve gastrectomy, gastric bypass, and adjustable gastric band (LAP-BAND).    Attend our [place] Choosing to undergo a bariatric procedure is a big decision, and one that should not be taken lightly.  You now have two options in how you learn about weight-loss surgery - in person or online.  Our objective is to ensure you have all of the information that you need to evaluate the advantages and obligations of this life changing procedure.  Please note that you are not alone in this process, and our experienced team is ready to assist and answer all of your questions.  There are several ways to register for a seminar (either on-line or in person): 1)  Call [phone] 2) Go on-line to [facility] and register for either type of seminar.  [email or web address]

## 2018-10-02 ENCOUNTER — Ambulatory Visit: Payer: BC Managed Care – PPO | Admitting: Endocrinology

## 2018-10-02 ENCOUNTER — Encounter: Payer: Self-pay | Admitting: Endocrinology

## 2018-10-02 VITALS — BP 148/70 | HR 90 | Ht 65.0 in | Wt 295.0 lb

## 2018-10-02 DIAGNOSIS — Z794 Long term (current) use of insulin: Secondary | ICD-10-CM

## 2018-10-02 DIAGNOSIS — E113599 Type 2 diabetes mellitus with proliferative diabetic retinopathy without macular edema, unspecified eye: Secondary | ICD-10-CM

## 2018-10-02 LAB — POCT GLYCOSYLATED HEMOGLOBIN (HGB A1C): HEMOGLOBIN A1C: 9 % — AB (ref 4.0–5.6)

## 2018-10-02 MED ORDER — SEMAGLUTIDE (1 MG/DOSE) 2 MG/1.5ML ~~LOC~~ SOPN: 1.0000 mg | PEN_INJECTOR | SUBCUTANEOUS | 11 refills | Status: DC

## 2018-10-02 NOTE — Progress Notes (Signed)
Subjective:    Patient ID: Brian Sweeney, male    DOB: Mar 10, 1956, 63 y.o.   MRN: [id number]  HPI Pt returns for f/u of diabetes mellitus: DM type: Insulin-requiring type 2 Dx'ed: 9528 Complications: polyneuropathy and PDR Therapy: insulin since 2018, and 2 oral meds DKA: never Severe hypoglycemia: never Pancreatitis: never.  Pancreatic imaging: never.   Other: he did not tolerate metformin (diarrhea), or Trulicity (injection site rash); he declines multiple daily injections.   Interval history: Meter is downloaded today, and the printout is scanned into the record. cbg's vary from 110-200.  There is no trend throughout the day.  pt states he feels well in general.  Pt says he never misses DM meds.    Past Medical History:  Diagnosis Date  . Allergic rhinitis   . Anxiety   . Chronic right shoulder pain   . Depression   . Hypercholesterolemia   . Hypertension   . Migraine   . Obesity, morbid ([place])   . Sleep apnea   . Type II diabetes mellitus, uncontrolled ([place])     Past Surgical History:  Procedure Laterality Date  . APPENDECTOMY    . ROTATOR CUFF REPAIR Right   . TUMOR REMOVAL      Social History   Socioeconomic History  . Marital status: Married    Spouse name: Not on file  . Number of children: Not on file  . Years of education: Not on file  . Highest education level: Not on file  Occupational History  . Not on file  Social Needs  . Financial resource strain: Not on file  . Food insecurity:    Worry: Not on file    Inability: Not on file  . Transportation needs:    Medical: Not on file    Non-medical: Not on file  Tobacco Use  . Smoking status: Former Research scientist (life sciences)  . Smokeless tobacco: Never Used  Substance and Sexual Activity  . Alcohol use: Not Currently    Frequency: Never  . Drug use: Not Currently  . Sexual activity: Not on file  Lifestyle  . Physical activity:    Days per week: Not on file    Minutes per session: Not on file  . Stress: Not on  file  Relationships  . Social connections:    Talks on phone: Not on file    Gets together: Not on file    Attends religious service: Not on file    Active member of club or organization: Not on file    Attends meetings of clubs or organizations: Not on file    Relationship status: Not on file  . Intimate partner violence:    Fear of current or ex partner: Not on file    Emotionally abused: Not on file    Physically abused: Not on file    Forced sexual activity: Not on file  Other Topics Concern  . Not on file  Social History Narrative  . Not on file    Current Outpatient Medications on File Prior to Visit  Medication Sig Dispense Refill  . BD PEN NEEDLE NANO U/F 32G X 4 MM MISC Inject 1 Syringe into the skin 2 (two) times daily. 100 each 11  . cetirizine (ZYRTEC) 10 MG tablet Take 10 mg by mouth daily.    . dapagliflozin propanediol (FARXIGA) 10 MG TABS tablet Take 10 mg by mouth daily. 30 tablet 11  . esomeprazole (NEXIUM) 20 MG capsule Take 20 mg by mouth daily  at 12 noon.    . furosemide (LASIX) 40 MG tablet     . Insulin Degludec (TRESIBA FLEXTOUCH) 200 UNIT/ML SOPN Inject 180 Units into the skin daily. 9 pen 11  . Olmesartan-amLODIPine-HCTZ (TRIBENZOR) 40-10-25 MG TABS daily.    Glory Rosebush VERIO test strip     . potassium chloride SA (K-DUR,KLOR-CON) 20 MEQ tablet      No current facility-administered medications on file prior to visit.     Allergies  Allergen Reactions  . Invokana [Canagliflozin]   . Metformin And Related Diarrhea  . Trulicity [Dulaglutide]   . Bydureon [Exenatide] Rash  . Tanzeum [Albiglutide] Rash    Family History  Problem Relation Age of Onset  . Diabetes Mother     BP (!) 148/70 (BP Location: Right Arm, Patient Position: Sitting, Cuff Size: Large)   Pulse 90   Ht 5\' 5"  (1.651 m)   Wt 295 lb (133.8 kg)   SpO2 91%   BMI 49.09 kg/m    Review of Systems He denies hypoglycemia    Objective:   Physical Exam VITAL SIGNS:  See vs  page GENERAL: no distress Pulses: foot pulses are intact bilaterally.   MSK: no deformity of the feet or ankles.  CV: 1+ bilat edema of the legs, and bilat vv's.   Skin:  no ulcer on the feet or ankles.  normal color and temp on the feet and ankles Neuro: sensation is intact to touch on the feet and ankles.      Lab Results  Component Value Date   HGBA1C 9.0 (A) 10/02/2018       Assessment & Plan:  HTN: is noted today Insulin-requiring type 2 DM, with PDR: worse. Edema: this limits rx options.  Patient Instructions  Your blood pressure is high today.  Please see your primary care provider soon, to have it rechecked I have sent a prescription to your pharmacy, to change Januvia to "Ozempic."  Please call if you have nausea on this. Please continue the same other diabetes medications, including the [place].  check your blood sugar twice a day.  vary the time of day when you check, between before the 3 meals, and at bedtime.  also check if you have symptoms of your blood sugar being too high or too low.  please keep a record of the readings and bring it to your next appointment here (or you can bring the meter itself).  You can write it on any piece of paper.  please call us sooner if your blood sugar goes below 70, or if you have a lot of readings over 200. Please come back for a follow-up appointment in 2 months.

## 2018-10-02 NOTE — Patient Instructions (Addendum)
Your blood pressure is high today.  Please see your primary care provider soon, to have it rechecked I have sent a prescription to your pharmacy, to change Januvia to "Ozempic."  Please call if you have nausea on this. Please continue the same other diabetes medications, including the [place].  check your blood sugar twice a day.  vary the time of day when you check, between before the 3 meals, and at bedtime.  also check if you have symptoms of your blood sugar being too high or too low.  please keep a record of the readings and bring it to your next appointment here (or you can bring the meter itself).  You can write it on any piece of paper.  please call us sooner if your blood sugar goes below 70, or if you have a lot of readings over 200. Please come back for a follow-up appointment in 2 months.

## 2018-10-26 ENCOUNTER — Encounter: Payer: Self-pay | Admitting: Endocrinology

## 2018-10-27 ENCOUNTER — Other Ambulatory Visit: Payer: Self-pay | Admitting: Endocrinology

## 2018-10-27 MED ORDER — FLUCONAZOLE 150 MG PO TABS: 150.0000 mg | ORAL_TABLET | Freq: Every day | ORAL | 2 refills | Status: DC

## 2018-10-27 NOTE — Telephone Encounter (Signed)
My chart message.  7 minutes spent. please contact patient: Please sty off the insulin, and continue other meds. I have sent a prescription to your pharmacy, for the rash. I'll see you next time.

## 2018-11-16 ENCOUNTER — Telehealth: Payer: Self-pay | Admitting: Endocrinology

## 2018-11-16 ENCOUNTER — Encounter: Payer: Self-pay | Admitting: Endocrinology

## 2018-11-16 NOTE — Telephone Encounter (Signed)
Thank you. Link to complete virtual visit through [email or web address] has been sent. Pre-visit planning also completed

## 2018-11-16 NOTE — Telephone Encounter (Signed)
Patient had an appointment scheduled on May 1 with Dr.Ellison, patient was wanting to do an in patient visit with symptoms of diarrhea. I advised with that we would need to do Virtual Visit. Patient became upset and I advised we could put him in as soon as tomorrow to do his appointment. He agreed and scheduled.

## 2018-11-17 ENCOUNTER — Other Ambulatory Visit: Payer: Self-pay

## 2018-11-17 ENCOUNTER — Ambulatory Visit (INDEPENDENT_AMBULATORY_CARE_PROVIDER_SITE_OTHER): Payer: BC Managed Care – PPO | Admitting: Endocrinology

## 2018-11-17 DIAGNOSIS — E113599 Type 2 diabetes mellitus with proliferative diabetic retinopathy without macular edema, unspecified eye: Secondary | ICD-10-CM

## 2018-11-17 DIAGNOSIS — Z794 Long term (current) use of insulin: Secondary | ICD-10-CM | POA: Diagnosis not present

## 2018-11-17 MED ORDER — SEMAGLUTIDE(0.25 OR 0.5MG/DOS) 2 MG/1.5ML ~~LOC~~ SOPN: 0.5000 mg | PEN_INJECTOR | SUBCUTANEOUS | 11 refills | Status: DC

## 2018-11-17 MED ORDER — DAPAGLIFLOZIN PROPANEDIOL 5 MG PO TABS: 5.0000 mg | ORAL_TABLET | Freq: Every day | ORAL | 3 refills | Status: DC

## 2018-11-17 NOTE — Patient Instructions (Signed)
I have sent prescriptions to your pharmacy, to half the [place] and [place]. Please continue the same other diabetes medications.  Please come back for a follow-up appointment in 1 month.

## 2018-11-17 NOTE — Progress Notes (Addendum)
Subjective:    Patient ID: Brian Sweeney, male    DOB: [birth year], 63 y.o.   MRN: [id number]  HPI  telehealth visit today via doxy video visit.  Alternatives to telehealth are presented to this patient, and the patient agrees to the telehealth visit. Pt is advised of the cost of the visit, and agrees to this, also.   Patient is at home, and I am at the office.  .  Pt returns for f/u of diabetes mellitus: DM type: Insulin-requiring type 2 Dx'ed: 0867 Complications: polyneuropathy and PDR.  Therapy: insulin since 2018, Ozempic, and Farxiga.   DKA: never Severe hypoglycemia: never.  Pancreatitis: never.  Pancreatic imaging: never.   Other: he did not tolerate metformin (diarrhea), or Trulicity (injection site rash); he declines multiple daily injections.   Interval history: Pt reports 2 weeks of itching at the genital area, and assoc diarrhea.  He says cbg varies from 83-178. He does not miss DM meds.  Past Medical History:  Diagnosis Date  . Allergic rhinitis   . Anxiety   . Chronic right shoulder pain   . Depression   . Hypercholesterolemia   . Hypertension   . Migraine   . Obesity, morbid ([place])   . Sleep apnea   . Type II diabetes mellitus, uncontrolled ([place])     Past Surgical History:  Procedure Laterality Date  . APPENDECTOMY    . ROTATOR CUFF REPAIR Right   . TUMOR REMOVAL      Social History   Socioeconomic History  . Marital status: Married    Spouse name: Not on file  . Number of children: Not on file  . Years of education: Not on file  . Highest education level: Not on file  Occupational History  . Not on file  Social Needs  . Financial resource strain: Not on file  . Food insecurity:    Worry: Not on file    Inability: Not on file  . Transportation needs:    Medical: Not on file    Non-medical: Not on file  Tobacco Use  . Smoking status: Former Research scientist (life sciences)  . Smokeless tobacco: Never Used  Substance and Sexual Activity  . Alcohol use: Not Currently     Frequency: Never  . Drug use: Not Currently  . Sexual activity: Not on file  Lifestyle  . Physical activity:    Days per week: Not on file    Minutes per session: Not on file  . Stress: Not on file  Relationships  . Social connections:    Talks on phone: Not on file    Gets together: Not on file    Attends religious service: Not on file    Active member of club or organization: Not on file    Attends meetings of clubs or organizations: Not on file    Relationship status: Not on file  . Intimate partner violence:    Fear of current or ex partner: Not on file    Emotionally abused: Not on file    Physically abused: Not on file    Forced sexual activity: Not on file  Other Topics Concern  . Not on file  Social History Narrative  . Not on file    Current Outpatient Medications on File Prior to Visit  Medication Sig Dispense Refill  . BD PEN NEEDLE NANO U/F 32G X 4 MM MISC Inject 1 Syringe into the skin 2 (two) times daily. 100 each 11  . cetirizine (ZYRTEC) 10  MG tablet Take 10 mg by mouth daily.    Marland Kitchen esomeprazole (NEXIUM) 20 MG capsule Take 20 mg by mouth daily at 12 noon.    . fluconazole (DIFLUCAN) 150 MG tablet Take 1 tablet (150 mg total) by mouth daily. 3 tablet 2  . furosemide (LASIX) 40 MG tablet     . Insulin Degludec (TRESIBA FLEXTOUCH) 200 UNIT/ML SOPN Inject 180 Units into the skin daily. 9 pen 11  . Olmesartan-amLODIPine-HCTZ (TRIBENZOR) 40-10-25 MG TABS daily.    Glory Rosebush VERIO test strip     . potassium chloride SA (K-DUR,KLOR-CON) 20 MEQ tablet      No current facility-administered medications on file prior to visit.     Allergies  Allergen Reactions  . Invokana [Canagliflozin]   . Metformin And Related Diarrhea  . Trulicity [Dulaglutide]   . Bydureon [Exenatide] Rash  . Tanzeum [Albiglutide] Rash    Family History  Problem Relation Age of Onset  . Diabetes Mother      Review of Systems He denies hypoglycemia.  He has lost a few lbs.       Objective:   Physical Exam     Assessment & Plan:  Insulin-requiring type 2 DM: uncertain glycemic control.  She declines a1c today Cutaneous yeast infection, due to farxiga Diarrhea: poss due to Ozempic  Patient Instructions  I have sent prescriptions to your pharmacy, to half the [place] and [place]. Please continue the same other diabetes medications.  Please come back for a follow-up appointment in 1 month.

## 2018-12-01 ENCOUNTER — Ambulatory Visit: Payer: BC Managed Care – PPO | Admitting: Endocrinology

## 2018-12-14 ENCOUNTER — Other Ambulatory Visit: Payer: Self-pay

## 2018-12-15 ENCOUNTER — Encounter: Payer: Self-pay | Admitting: Endocrinology

## 2018-12-15 ENCOUNTER — Ambulatory Visit (INDEPENDENT_AMBULATORY_CARE_PROVIDER_SITE_OTHER): Payer: BC Managed Care – PPO | Admitting: Endocrinology

## 2018-12-15 DIAGNOSIS — E113599 Type 2 diabetes mellitus with proliferative diabetic retinopathy without macular edema, unspecified eye: Secondary | ICD-10-CM

## 2018-12-15 DIAGNOSIS — Z794 Long term (current) use of insulin: Secondary | ICD-10-CM | POA: Diagnosis not present

## 2018-12-15 NOTE — Progress Notes (Signed)
Subjective:    Patient ID: Brian Sweeney, male    DOB: 05-Sep-1955, 63 y.o.   MRN: [id number]  HPI telehealth visit today via doxy video visit.  Alternatives to telehealth are presented to this patient, and the patient agrees to the telehealth visit. Pt is advised of the cost of the visit, and agrees to this, also.   Patient is at home, and I am at the office.  Pt returns for f/u of diabetes mellitus: DM type: Insulin-requiring type 2 Dx'ed: 7793 Complications: polyneuropathy and PDR.  Therapy: insulin since 2018, Ozempic, and Farxiga.   DKA: never Severe hypoglycemia: never.  Pancreatitis: never.  Pancreatic imaging: never.   Other: he did not tolerate metformin (diarrhea), or Trulicity (injection site rash); he declines multiple daily injections.   Interval history: Pt states cbg varies from 99-221.  avg is approx 170.  He has 6 weeks of intermitt n/v/d.  It is worse over the past few days.  Last doze of Ozempic was 8 days ago.   Past Medical History:  Diagnosis Date  . Allergic rhinitis   . Anxiety   . Chronic right shoulder pain   . Depression   . Hypercholesterolemia   . Hypertension   . Migraine   . Obesity, morbid ([place])   . Sleep apnea   . Type II diabetes mellitus, uncontrolled ([place])     Past Surgical History:  Procedure Laterality Date  . APPENDECTOMY    . ROTATOR CUFF REPAIR Right   . TUMOR REMOVAL      Social History   Socioeconomic History  . Marital status: Married    Spouse name: Not on file  . Number of children: Not on file  . Years of education: Not on file  . Highest education level: Not on file  Occupational History  . Not on file  Social Needs  . Financial resource strain: Not on file  . Food insecurity:    Worry: Not on file    Inability: Not on file  . Transportation needs:    Medical: Not on file    Non-medical: Not on file  Tobacco Use  . Smoking status: Former Research scientist (life sciences)  . Smokeless tobacco: Never Used  Substance and Sexual Activity   . Alcohol use: Not Currently    Frequency: Never  . Drug use: Not Currently  . Sexual activity: Not on file  Lifestyle  . Physical activity:    Days per week: Not on file    Minutes per session: Not on file  . Stress: Not on file  Relationships  . Social connections:    Talks on phone: Not on file    Gets together: Not on file    Attends religious service: Not on file    Active member of club or organization: Not on file    Attends meetings of clubs or organizations: Not on file    Relationship status: Not on file  . Intimate partner violence:    Fear of current or ex partner: Not on file    Emotionally abused: Not on file    Physically abused: Not on file    Forced sexual activity: Not on file  Other Topics Concern  . Not on file  Social History Narrative  . Not on file    Current Outpatient Medications on File Prior to Visit  Medication Sig Dispense Refill  . BD PEN NEEDLE NANO U/F 32G X 4 MM MISC Inject 1 Syringe into the skin 2 (two) times  daily. 100 each 11  . cetirizine (ZYRTEC) 10 MG tablet Take 10 mg by mouth daily.    . dapagliflozin propanediol (FARXIGA) 5 MG TABS tablet Take 5 mg by mouth daily. 90 tablet 3  . esomeprazole (NEXIUM) 20 MG capsule Take 20 mg by mouth daily at 12 noon.    . fluconazole (DIFLUCAN) 150 MG tablet Take 1 tablet (150 mg total) by mouth daily. 3 tablet 2  . furosemide (LASIX) 40 MG tablet     . Insulin Degludec (TRESIBA FLEXTOUCH) 200 UNIT/ML SOPN Inject 180 Units into the skin daily. 9 pen 11  . Olmesartan-amLODIPine-HCTZ (TRIBENZOR) 40-10-25 MG TABS daily.    Glory Rosebush VERIO test strip     . potassium chloride SA (K-DUR,KLOR-CON) 20 MEQ tablet      No current facility-administered medications on file prior to visit.     Allergies  Allergen Reactions  . Invokana [Canagliflozin]   . Metformin And Related Diarrhea  . Trulicity [Dulaglutide]   . Bydureon [Exenatide] Rash  . Tanzeum [Albiglutide] Rash    Family History  Problem  Relation Age of Onset  . Diabetes Mother     Review of Systems He denies hypoglycemia.  Denies fever.      Objective:   Physical Exam       Assessment & Plan:  Insulin-requiring type 2 DM, with PDR GI sxs: worse: uncertain if caused by Ozempic  Patient Instructions  Please go off the Ozempic. Please continue the same other diabetes medications. Please come back for a follow-up appointment in 1-2 weeks, in person.

## 2018-12-15 NOTE — Patient Instructions (Signed)
Please go off the Ozempic. Please continue the same other diabetes medications. Please come back for a follow-up appointment in 1-2 weeks, in person.

## 2018-12-18 ENCOUNTER — Ambulatory Visit: Payer: BC Managed Care – PPO | Admitting: Endocrinology

## 2018-12-20 ENCOUNTER — Encounter: Payer: Self-pay | Admitting: Endocrinology

## 2018-12-21 NOTE — Telephone Encounter (Signed)
Please advise 

## 2018-12-22 ENCOUNTER — Ambulatory Visit (INDEPENDENT_AMBULATORY_CARE_PROVIDER_SITE_OTHER): Payer: BC Managed Care – PPO | Admitting: Endocrinology

## 2018-12-22 ENCOUNTER — Encounter: Payer: Self-pay | Admitting: Endocrinology

## 2018-12-22 DIAGNOSIS — Z794 Long term (current) use of insulin: Secondary | ICD-10-CM | POA: Diagnosis not present

## 2018-12-22 DIAGNOSIS — E113599 Type 2 diabetes mellitus with proliferative diabetic retinopathy without macular edema, unspecified eye: Secondary | ICD-10-CM

## 2018-12-22 NOTE — Patient Instructions (Addendum)
Please stay off the farxiga and ozempic.  Please continue the same [place].   Please increase the tresiba to 180 units QD.   I'll see you next week.

## 2018-12-22 NOTE — Telephone Encounter (Signed)
Scheduled for VV today (12/22/18)

## 2018-12-22 NOTE — Progress Notes (Signed)
Subjective:    Patient ID: Brian Sweeney, male    DOB: [birth year], 63 y.o.   MRN: [id number]  HPI telehealth visit today via doxy video visit.  Alternatives to telehealth are presented to this patient, and the patient agrees to the telehealth visit.   Pt is advised of the cost of the visit, and agrees to this, also.   Patient is at home, and I am at the office.   Persons attending the telehealth visit: the patient and I DM type: Insulin-requiring type 2 Dx'ed: 7846 Complications: polyneuropathy and PDR.  Therapy: insulin since 2018, Ozempic, and Farxiga.   DKA: never Severe hypoglycemia: never.  Pancreatitis: never.  Pancreatic imaging: never.   Other: he did not tolerate metformin (diarrhea), or Trulicity (injection site rash); he declines multiple daily injections.   Interval history: 1 month ago, he developed diarrhea, nausea, and flatulence.  he stopped the Ozempic 2 weeks ago, and flatulence/nausea resolved.  He stopped the farxiga, and diarrhea stopped.  He says cbg's are in the mid-100's.  He takes tresiba, 160 units qd.   He is still on [place], which he says does not cause GI sxs Past Medical History:  Diagnosis Date  . Allergic rhinitis   . Anxiety   . Chronic right shoulder pain   . Depression   . Hypercholesterolemia   . Hypertension   . Migraine   . Obesity, morbid ([place])   . Sleep apnea   . Type II diabetes mellitus, uncontrolled ([place])     Past Surgical History:  Procedure Laterality Date  . APPENDECTOMY    . ROTATOR CUFF REPAIR Right   . TUMOR REMOVAL      Social History   Socioeconomic History  . Marital status: Married    Spouse name: Not on file  . Number of children: Not on file  . Years of education: Not on file  . Highest education level: Not on file  Occupational History  . Not on file  Social Needs  . Financial resource strain: Not on file  . Food insecurity:    Worry: Not on file    Inability: Not on file  . Transportation needs:   Medical: Not on file    Non-medical: Not on file  Tobacco Use  . Smoking status: Former Research scientist (life sciences)  . Smokeless tobacco: Never Used  Substance and Sexual Activity  . Alcohol use: Not Currently    Frequency: Never  . Drug use: Not Currently  . Sexual activity: Not on file  Lifestyle  . Physical activity:    Days per week: Not on file    Minutes per session: Not on file  . Stress: Not on file  Relationships  . Social connections:    Talks on phone: Not on file    Gets together: Not on file    Attends religious service: Not on file    Active member of club or organization: Not on file    Attends meetings of clubs or organizations: Not on file    Relationship status: Not on file  . Intimate partner violence:    Fear of current or ex partner: Not on file    Emotionally abused: Not on file    Physically abused: Not on file    Forced sexual activity: Not on file  Other Topics Concern  . Not on file  Social History Narrative  . Not on file    Current Outpatient Medications on File Prior to Visit  Medication Sig Dispense  Refill  . BD PEN NEEDLE NANO U/F 32G X 4 MM MISC Inject 1 Syringe into the skin 2 (two) times daily. 100 each 11  . cetirizine (ZYRTEC) 10 MG tablet Take 10 mg by mouth daily.    Marland Kitchen esomeprazole (NEXIUM) 20 MG capsule Take 20 mg by mouth daily at 12 noon.    . fluconazole (DIFLUCAN) 150 MG tablet Take 1 tablet (150 mg total) by mouth daily. 3 tablet 2  . furosemide (LASIX) 40 MG tablet     . Insulin Degludec (TRESIBA FLEXTOUCH) 200 UNIT/ML SOPN Inject 180 Units into the skin daily. 9 pen 11  . Olmesartan-amLODIPine-HCTZ (TRIBENZOR) 40-10-25 MG TABS daily.    Glory Rosebush VERIO test strip     . potassium chloride SA (K-DUR,KLOR-CON) 20 MEQ tablet      No current facility-administered medications on file prior to visit.     Allergies  Allergen Reactions  . Invokana [Canagliflozin]   . Metformin And Related Diarrhea  . Trulicity [Dulaglutide]   . Bydureon  [Exenatide] Rash  . Tanzeum [Albiglutide] Rash    Family History  Problem Relation Age of Onset  . Diabetes Mother      Review of Systems denies hypoglycemia and nausea.      Objective:   Physical Exam       Assessment & Plan:  Insulin-requiring type 2 DM, with PDR.  Nausea, due to Ozempic Diarrhea, new.  uncertain etiology  Patient Instructions  Please stay off the farxiga and ozempic.  Please continue the same [place].   Please increase the tresiba to 180 units QD.   I'll see you next week.

## 2018-12-27 ENCOUNTER — Other Ambulatory Visit: Payer: Self-pay

## 2018-12-29 ENCOUNTER — Encounter: Payer: Self-pay | Admitting: Endocrinology

## 2018-12-29 ENCOUNTER — Ambulatory Visit: Payer: BC Managed Care – PPO | Admitting: Endocrinology

## 2018-12-29 ENCOUNTER — Other Ambulatory Visit: Payer: Self-pay

## 2018-12-29 VITALS — BP 132/78 | HR 83 | Temp 98.1°F | Wt 279.6 lb

## 2018-12-29 DIAGNOSIS — Z794 Long term (current) use of insulin: Secondary | ICD-10-CM

## 2018-12-29 DIAGNOSIS — E113599 Type 2 diabetes mellitus with proliferative diabetic retinopathy without macular edema, unspecified eye: Secondary | ICD-10-CM | POA: Diagnosis not present

## 2018-12-29 LAB — POCT GLYCOSYLATED HEMOGLOBIN (HGB A1C): Hemoglobin A1C: 7.7 % — AB (ref 4.0–5.6)

## 2018-12-29 MED ORDER — SITAGLIPTIN PHOSPHATE 100 MG PO TABS: 100.0000 mg | ORAL_TABLET | Freq: Every day | ORAL | 11 refills | Status: DC

## 2018-12-29 NOTE — Patient Instructions (Addendum)
Please continue the same medications.   check your blood sugar twice a day.  vary the time of day when you check, between before the 3 meals, and at bedtime.  also check if you have symptoms of your blood sugar being too high or too low.  please keep a record of the readings and bring it to your next appointment here (or you can bring the meter itself).  You can write it on any piece of paper.  please call us sooner if your blood sugar goes below 70, or if you have a lot of readings over 200.   Please come back for a follow-up appointment in 3 months.    

## 2018-12-29 NOTE — Progress Notes (Signed)
Subjective:    Patient ID: Brian Sweeney, male    DOB: 1956-01-05, 63 y.o.   MRN: [id number]  HPI  Pt returns for f/u of DM DM type: Insulin-requiring type 2 Dx'ed: 4765 Complications: polyneuropathy and PDR.  Therapy: insulin since 2018, and januvia.   DKA: never Severe hypoglycemia: never.  Pancreatitis: never.  Pancreatic imaging: never.   Other: he did not tolerate metformin (diarrhea), Farxiga (diarrhea), Ozempic (nausea), or Trulicity (injection site rash); he declines multiple daily injections.   Interval history: Pt says cbg varies from 108-300.  It is in general higher as the day goes on.  pt states he feels well in general.   Past Medical History:  Diagnosis Date  . Allergic rhinitis   . Anxiety   . Chronic right shoulder pain   . Depression   . Hypercholesterolemia   . Hypertension   . Migraine   . Obesity, morbid ([place])   . Sleep apnea   . Type II diabetes mellitus, uncontrolled ([place])     Past Surgical History:  Procedure Laterality Date  . APPENDECTOMY    . ROTATOR CUFF REPAIR Right   . TUMOR REMOVAL      Social History   Socioeconomic History  . Marital status: Married    Spouse name: Not on file  . Number of children: Not on file  . Years of education: Not on file  . Highest education level: Not on file  Occupational History  . Not on file  Social Needs  . Financial resource strain: Not on file  . Food insecurity:    Worry: Not on file    Inability: Not on file  . Transportation needs:    Medical: Not on file    Non-medical: Not on file  Tobacco Use  . Smoking status: Former Research scientist (life sciences)  . Smokeless tobacco: Never Used  Substance and Sexual Activity  . Alcohol use: Not Currently    Frequency: Never  . Drug use: Not Currently  . Sexual activity: Not on file  Lifestyle  . Physical activity:    Days per week: Not on file    Minutes per session: Not on file  . Stress: Not on file  Relationships  . Social connections:    Talks on phone: Not  on file    Gets together: Not on file    Attends religious service: Not on file    Active member of club or organization: Not on file    Attends meetings of clubs or organizations: Not on file    Relationship status: Not on file  . Intimate partner violence:    Fear of current or ex partner: Not on file    Emotionally abused: Not on file    Physically abused: Not on file    Forced sexual activity: Not on file  Other Topics Concern  . Not on file  Social History Narrative  . Not on file    Current Outpatient Medications on File Prior to Visit  Medication Sig Dispense Refill  . BD PEN NEEDLE NANO U/F 32G X 4 MM MISC Inject 1 Syringe into the skin 2 (two) times daily. 100 each 11  . cetirizine (ZYRTEC) 10 MG tablet Take 10 mg by mouth daily.    Marland Kitchen esomeprazole (NEXIUM) 20 MG capsule Take 20 mg by mouth daily at 12 noon.    . fluconazole (DIFLUCAN) 150 MG tablet Take 1 tablet (150 mg total) by mouth daily. 3 tablet 2  . furosemide (LASIX)  40 MG tablet     . Insulin Degludec (TRESIBA FLEXTOUCH) 200 UNIT/ML SOPN Inject 180 Units into the skin daily. 9 pen 11  . Olmesartan-amLODIPine-HCTZ (TRIBENZOR) 40-10-25 MG TABS daily.    Glory Rosebush VERIO test strip     . potassium chloride SA (K-DUR,KLOR-CON) 20 MEQ tablet      No current facility-administered medications on file prior to visit.     Allergies  Allergen Reactions  . Invokana [Canagliflozin]   . Metformin And Related Diarrhea  . Trulicity [Dulaglutide]   . Bydureon [Exenatide] Rash  . Tanzeum [Albiglutide] Rash    Family History  Problem Relation Age of Onset  . Diabetes Mother     BP 132/78 (BP Location: Right Arm, Patient Position: Sitting, Cuff Size: Large)   Pulse 83   Temp 98.1 F (36.7 C) (Oral)   Wt 279 lb 9.6 oz (126.8 kg)   SpO2 98%   BMI 46.53 kg/m   Review of Systems He denies hypoglycemia    Objective:   Physical Exam VITAL SIGNS:  See vs page.  GENERAL: no distress.   Pulses: dorsalis pedis  intact bilat.   MSK: no deformity of the feet.   CV: trace bilat leg edema.   Skin:  no ulcer on the feet.  normal color and temp on the feet.   Neuro: sensation is intact to touch on the feet.    Lab Results  Component Value Date   HGBA1C 7.7 (A) 12/29/2018   Lab Results  Component Value Date   CREATININE 1.04 02/01/2018   BUN 19 02/01/2018   NA 141 02/01/2018   K 2.9 (L) 02/01/2018   CL 99 02/01/2018   CO2 31 02/01/2018       Assessment & Plan:  Insulin-requiring type 2 DM: this is the best control this pt should aim for, given this regimen, which does match insulin to his changing needs throughout the day Nausea: although this is better, it limits rx options   Patient Instructions  Please continue the same medications. check your blood sugar twice a day.  vary the time of day when you check, between before the 3 meals, and at bedtime.  also check if you have symptoms of your blood sugar being too high or too low.  please keep a record of the readings and bring it to your next appointment here (or you can bring the meter itself).  You can write it on any piece of paper.  please call us sooner if your blood sugar goes below 70, or if you have a lot of readings over 200. Please come back for a follow-up appointment in 3 months.

## 2019-01-22 ENCOUNTER — Other Ambulatory Visit: Payer: Self-pay | Admitting: Endocrinology

## 2019-01-23 ENCOUNTER — Other Ambulatory Visit: Payer: Self-pay

## 2019-01-23 DIAGNOSIS — E113599 Type 2 diabetes mellitus with proliferative diabetic retinopathy without macular edema, unspecified eye: Secondary | ICD-10-CM

## 2019-01-23 DIAGNOSIS — Z794 Long term (current) use of insulin: Secondary | ICD-10-CM

## 2019-01-23 MED ORDER — SITAGLIPTIN PHOSPHATE 100 MG PO TABS: 100.0000 mg | ORAL_TABLET | Freq: Every day | ORAL | 11 refills | Status: DC

## 2019-03-16 ENCOUNTER — Other Ambulatory Visit: Payer: Self-pay

## 2019-03-20 ENCOUNTER — Encounter: Payer: Self-pay | Admitting: Endocrinology

## 2019-03-20 ENCOUNTER — Other Ambulatory Visit: Payer: Self-pay

## 2019-03-20 ENCOUNTER — Ambulatory Visit: Payer: BC Managed Care – PPO | Admitting: Endocrinology

## 2019-03-20 VITALS — BP 114/66 | HR 86 | Ht 65.0 in | Wt 282.8 lb

## 2019-03-20 DIAGNOSIS — Z794 Long term (current) use of insulin: Secondary | ICD-10-CM

## 2019-03-20 DIAGNOSIS — E113599 Type 2 diabetes mellitus with proliferative diabetic retinopathy without macular edema, unspecified eye: Secondary | ICD-10-CM | POA: Diagnosis not present

## 2019-03-20 LAB — POCT GLYCOSYLATED HEMOGLOBIN (HGB A1C): Hemoglobin A1C: 7.6 % — AB (ref 4.0–5.6)

## 2019-03-20 NOTE — Progress Notes (Signed)
Subjective:    Patient ID: Brian Sweeney, male    DOB: Dec 23, 1955, 63 y.o.   MRN: [id number]  HPI Pt returns for f/u of DM DM type: Insulin-requiring type 2 Dx'ed: 2979 Complications: polyneuropathy and PDR.  Therapy: insulin since 2018, and januvia.   DKA: never Severe hypoglycemia: never.  Pancreatitis: never.  Pancreatic imaging: never.   Other: he did not tolerate metformin (diarrhea), Farxiga (diarrhea), Ozempic (nausea), or Trulicity (injection site rash); he declines multiple daily injections.   Interval history: Meter is downloaded today, and the printout is scanned into the record.  cbg varies from 117-223.  There is no trend throughout the day.  pt states he feels well in general.   Past Medical History:  Diagnosis Date  . Allergic rhinitis   . Anxiety   . Chronic right shoulder pain   . Depression   . Hypercholesterolemia   . Hypertension   . Migraine   . Obesity, morbid ([place])   . Sleep apnea   . Type II diabetes mellitus, uncontrolled ([place])     Past Surgical History:  Procedure Laterality Date  . APPENDECTOMY    . ROTATOR CUFF REPAIR Right   . TUMOR REMOVAL      Social History   Socioeconomic History  . Marital status: Married    Spouse name: Not on file  . Number of children: Not on file  . Years of education: Not on file  . Highest education level: Not on file  Occupational History  . Not on file  Social Needs  . Financial resource strain: Not on file  . Food insecurity    Worry: Not on file    Inability: Not on file  . Transportation needs    Medical: Not on file    Non-medical: Not on file  Tobacco Use  . Smoking status: Former Research scientist (life sciences)  . Smokeless tobacco: Never Used  Substance and Sexual Activity  . Alcohol use: Not Currently    Frequency: Never  . Drug use: Not Currently  . Sexual activity: Not on file  Lifestyle  . Physical activity    Days per week: Not on file    Minutes per session: Not on file  . Stress: Not on file   Relationships  . Social Herbalist on phone: Not on file    Gets together: Not on file    Attends religious service: Not on file    Active member of club or organization: Not on file    Attends meetings of clubs or organizations: Not on file    Relationship status: Not on file  . Intimate partner violence    Fear of current or ex partner: Not on file    Emotionally abused: Not on file    Physically abused: Not on file    Forced sexual activity: Not on file  Other Topics Concern  . Not on file  Social History Narrative  . Not on file    Current Outpatient Medications on File Prior to Visit  Medication Sig Dispense Refill  . BD PEN NEEDLE NANO U/F 32G X 4 MM MISC Inject 1 Syringe into the skin 2 (two) times daily. 100 each 11  . cetirizine (ZYRTEC) 10 MG tablet Take 10 mg by mouth daily.    Marland Kitchen esomeprazole (NEXIUM) 20 MG capsule Take 20 mg by mouth daily at 12 noon.    . furosemide (LASIX) 40 MG tablet     . Olmesartan-amLODIPine-HCTZ (TRIBENZOR) 40-10-25 MG  TABS daily.    Glory Rosebush VERIO test strip     . potassium chloride SA (K-DUR,KLOR-CON) 20 MEQ tablet     . sitaGLIPtin (JANUVIA) 100 MG tablet Take 1 tablet (100 mg total) by mouth daily. 30 tablet 11  . TRESIBA FLEXTOUCH 200 UNIT/ML SOPN INJECT 180 UNITS UNDER THE SKIN ONE TIME DAILY 27 mL 4   No current facility-administered medications on file prior to visit.     Allergies  Allergen Reactions  . Invokana [Canagliflozin]   . Metformin And Related Diarrhea  . Trulicity [Dulaglutide]   . Bydureon [Exenatide] Rash  . Tanzeum [Albiglutide] Rash    Family History  Problem Relation Age of Onset  . Diabetes Mother     BP 114/66 (BP Location: Left Arm, Patient Position: Sitting, Cuff Size: Large)   Pulse 86   Ht 5\' 5"  (1.651 m)   Wt 282 lb 12.8 oz (128.3 kg)   SpO2 96%   BMI 47.06 kg/m    Review of Systems He denies hypoglycemia.      Objective:   Physical Exam VITAL SIGNS:  See vs page GENERAL:  no distress Pulses: dorsalis pedis intact bilat.   MSK: no deformity of the feet CV: 1+ bilat leg edema, and bilat vv's. Skin:  no ulcer on the feet.  normal color and temp on the feet. Neuro: sensation is intact to touch on the feet.   Lab Results  Component Value Date   HGBA1C 7.6 (A) 03/20/2019   Lab Results  Component Value Date   CREATININE 1.04 02/01/2018   BUN 19 02/01/2018   NA 141 02/01/2018   K 2.9 (L) 02/01/2018   CL 99 02/01/2018   CO2 31 02/01/2018      Assessment & Plan:  Insulin-requiring type 2 DM, with PDR: this is the best control this pt should aim for, given this regimen, which does match insulin to his changing needs throughout the day. Edema: This limits rx options   Patient Instructions  Please continue the same medications.  check your blood sugar twice a day.  vary the time of day when you check, between before the 3 meals, and at bedtime.  also check if you have symptoms of your blood sugar being too high or too low.  please keep a record of the readings and bring it to your next appointment here (or you can bring the meter itself).  You can write it on any piece of paper.  please call us sooner if your blood sugar goes below 70, or if you have a lot of readings over 200. Please come back for a follow-up appointment in 3 months.

## 2019-03-20 NOTE — Patient Instructions (Signed)
Please continue the same medications.   check your blood sugar twice a day.  vary the time of day when you check, between before the 3 meals, and at bedtime.  also check if you have symptoms of your blood sugar being too high or too low.  please keep a record of the readings and bring it to your next appointment here (or you can bring the meter itself).  You can write it on any piece of paper.  please call us sooner if your blood sugar goes below 70, or if you have a lot of readings over 200.   Please come back for a follow-up appointment in 3 months.    

## 2019-05-04 ENCOUNTER — Other Ambulatory Visit: Payer: Self-pay

## 2019-05-04 DIAGNOSIS — E113599 Type 2 diabetes mellitus with proliferative diabetic retinopathy without macular edema, unspecified eye: Secondary | ICD-10-CM

## 2019-05-04 MED ORDER — BD PEN NEEDLE NANO U/F 32G X 4 MM MISC: 1.0000 | Freq: Two times a day (BID) | 11 refills | Status: DC

## 2019-05-14 ENCOUNTER — Other Ambulatory Visit: Payer: Self-pay

## 2019-05-14 DIAGNOSIS — E113599 Type 2 diabetes mellitus with proliferative diabetic retinopathy without macular edema, unspecified eye: Secondary | ICD-10-CM

## 2019-05-14 MED ORDER — ONETOUCH VERIO VI STRP: 1.0000 | ORAL_STRIP | Freq: Two times a day (BID) | 2 refills | Status: DC

## 2019-05-23 LAB — BASIC METABOLIC PANEL
BUN: 22 — AB (ref 4–21)
Creatinine: 1.2 (ref <=1.3)

## 2019-06-15 ENCOUNTER — Other Ambulatory Visit: Payer: Self-pay

## 2019-06-19 ENCOUNTER — Ambulatory Visit: Payer: BC Managed Care – PPO | Admitting: Endocrinology

## 2019-06-19 ENCOUNTER — Encounter: Payer: Self-pay | Admitting: Endocrinology

## 2019-06-19 ENCOUNTER — Other Ambulatory Visit: Payer: Self-pay

## 2019-06-19 VITALS — BP 132/72 | HR 91 | Ht 65.0 in | Wt 295.8 lb

## 2019-06-19 DIAGNOSIS — E113599 Type 2 diabetes mellitus with proliferative diabetic retinopathy without macular edema, unspecified eye: Secondary | ICD-10-CM | POA: Diagnosis not present

## 2019-06-19 DIAGNOSIS — Z794 Long term (current) use of insulin: Secondary | ICD-10-CM

## 2019-06-19 LAB — POCT GLYCOSYLATED HEMOGLOBIN (HGB A1C): Hemoglobin A1C: 7.9 % — AB (ref 4.0–5.6)

## 2019-06-19 LAB — MICROALBUMIN / CREATININE URINE RATIO: Microalb Creat Ratio: 25

## 2019-06-19 NOTE — Progress Notes (Signed)
Subjective:    Patient ID: Brian Sweeney, male    DOB: 1955-09-20, 63 y.o.   MRN: [id number]  HPI Pt returns for f/u of DM DM type: Insulin-requiring type 2 Dx'ed: 123456 Complications: polyneuropathy and PDR.  Therapy: insulin since 2018, and januvia.   DKA: never Severe hypoglycemia: never.  Pancreatitis: never.  Pancreatic imaging: never.   Other: he did not tolerate metformin (diarrhea), Farxiga (diarrhea), Ozempic (nausea), or Trulicity (injection site rash); he declines multiple daily injections.   Interval history: Meter is downloaded today, and the printout is scanned into the record.  cbg varies from 106-233.  There is no trend throughout the day.  pt states he feels well in general.   Past Medical History:  Diagnosis Date  . Allergic rhinitis   . Anxiety   . Chronic right shoulder pain   . Depression   . Hypercholesterolemia   . Hypertension   . Migraine   . Obesity, morbid ([place])   . Sleep apnea   . Type II diabetes mellitus, uncontrolled ([place])     Past Surgical History:  Procedure Laterality Date  . APPENDECTOMY    . ROTATOR CUFF REPAIR Right   . TUMOR REMOVAL      Social History   Socioeconomic History  . Marital status: Married    Spouse name: Not on file  . Number of children: Not on file  . Years of education: Not on file  . Highest education level: Not on file  Occupational History  . Not on file  Social Needs  . Financial resource strain: Not on file  . Food insecurity    Worry: Not on file    Inability: Not on file  . Transportation needs    Medical: Not on file    Non-medical: Not on file  Tobacco Use  . Smoking status: Former Research scientist (life sciences)  . Smokeless tobacco: Never Used  Substance and Sexual Activity  . Alcohol use: Not Currently    Frequency: Never  . Drug use: Not Currently  . Sexual activity: Not on file  Lifestyle  . Physical activity    Days per week: Not on file    Minutes per session: Not on file  . Stress: Not on file   Relationships  . Social Herbalist on phone: Not on file    Gets together: Not on file    Attends religious service: Not on file    Active member of club or organization: Not on file    Attends meetings of clubs or organizations: Not on file    Relationship status: Not on file  . Intimate partner violence    Fear of current or ex partner: Not on file    Emotionally abused: Not on file    Physically abused: Not on file    Forced sexual activity: Not on file  Other Topics Concern  . Not on file  Social History Narrative  . Not on file    Current Outpatient Medications on File Prior to Visit  Medication Sig Dispense Refill  . BD PEN NEEDLE NANO U/F 32G X 4 MM MISC 1 each by Other route 2 (two) times daily. 100 each 11  . cetirizine (ZYRTEC) 10 MG tablet Take 10 mg by mouth daily.    Marland Kitchen esomeprazole (NEXIUM) 20 MG capsule Take 20 mg by mouth daily at 12 noon.    . furosemide (LASIX) 40 MG tablet     . glucose blood (ONETOUCH VERIO) test  strip 1 each by Other route 2 (two) times daily. E11.9 100 each 2  . Olmesartan-amLODIPine-HCTZ (TRIBENZOR) 40-10-25 MG TABS daily.    . potassium chloride SA (K-DUR,KLOR-CON) 20 MEQ tablet     . sitaGLIPtin (JANUVIA) 100 MG tablet Take 1 tablet (100 mg total) by mouth daily. 30 tablet 11  . TRESIBA FLEXTOUCH 200 UNIT/ML SOPN INJECT 180 UNITS UNDER THE SKIN ONE TIME DAILY 27 mL 4   No current facility-administered medications on file prior to visit.     Allergies  Allergen Reactions  . Invokana [Canagliflozin]   . Metformin And Related Diarrhea  . Trulicity [Dulaglutide]   . Bydureon [Exenatide] Rash  . Tanzeum [Albiglutide] Rash    Family History  Problem Relation Age of Onset  . Diabetes Mother     BP 132/72 (BP Location: Right Wrist, Patient Position: Sitting, Cuff Size: Large)   Pulse 91   Ht 5\' 5"  (1.651 m)   Wt 295 lb 12.8 oz (134.2 kg)   SpO2 95%   BMI 49.22 kg/m    Review of Systems He denies hypoglycemia.       Objective:   Physical Exam VITAL SIGNS:  See vs page GENERAL: no distress Pulses: dorsalis pedis intact bilat.   MSK: no deformity of the feet CV: 1+ bilat leg edema, and bilat vv's Skin:  no ulcer on the feet.  normal color and temp on the feet.  Neuro: sensation is intact to touch on the feet.    A1c=7.9%  Lab Results  Component Value Date   CREATININE 1.04 02/01/2018   BUN 19 02/01/2018   NA 141 02/01/2018   K 2.9 (L) 02/01/2018   CL 99 02/01/2018   CO2 31 02/01/2018       Assessment & Plan:  Insulin-requiring type 2 DM: this is the best control this pt should aim for, given this regimen, which does match insulin to his changing needs throughout the day Edema: This limits rx options.   Patient Instructions  Please continue the same medications.  check your blood sugar twice a day.  vary the time of day when you check, between before the 3 meals, and at bedtime.  also check if you have symptoms of your blood sugar being too high or too low.  please keep a record of the readings and bring it to your next appointment here (or you can bring the meter itself).  You can write it on any piece of paper.  please call us sooner if your blood sugar goes below 70, or if you have a lot of readings over 200. Please come back for a follow-up appointment in 3 months.

## 2019-06-19 NOTE — Patient Instructions (Signed)
Please continue the same medications.   check your blood sugar twice a day.  vary the time of day when you check, between before the 3 meals, and at bedtime.  also check if you have symptoms of your blood sugar being too high or too low.  please keep a record of the readings and bring it to your next appointment here (or you can bring the meter itself).  You can write it on any piece of paper.  please call us sooner if your blood sugar goes below 70, or if you have a lot of readings over 200.   Please come back for a follow-up appointment in 3 months.    

## 2019-07-09 ENCOUNTER — Other Ambulatory Visit: Payer: Self-pay

## 2019-07-09 DIAGNOSIS — E119 Type 2 diabetes mellitus without complications: Secondary | ICD-10-CM

## 2019-07-09 MED ORDER — TRESIBA FLEXTOUCH 200 UNIT/ML ~~LOC~~ SOPN: 180.0000 [IU] | PEN_INJECTOR | Freq: Every day | SUBCUTANEOUS | 0 refills | Status: DC

## 2019-09-20 ENCOUNTER — Ambulatory Visit: Payer: BC Managed Care – PPO | Admitting: Endocrinology

## 2019-10-02 ENCOUNTER — Other Ambulatory Visit: Payer: Self-pay

## 2019-10-04 ENCOUNTER — Other Ambulatory Visit: Payer: Self-pay

## 2019-10-04 ENCOUNTER — Encounter: Payer: Self-pay | Admitting: Endocrinology

## 2019-10-04 ENCOUNTER — Ambulatory Visit: Payer: BC Managed Care – PPO | Admitting: Endocrinology

## 2019-10-04 VITALS — BP 142/78 | HR 95 | Ht 65.0 in | Wt 301.6 lb

## 2019-10-04 DIAGNOSIS — Z794 Long term (current) use of insulin: Secondary | ICD-10-CM

## 2019-10-04 DIAGNOSIS — E119 Type 2 diabetes mellitus without complications: Secondary | ICD-10-CM

## 2019-10-04 DIAGNOSIS — E113599 Type 2 diabetes mellitus with proliferative diabetic retinopathy without macular edema, unspecified eye: Secondary | ICD-10-CM | POA: Diagnosis not present

## 2019-10-04 LAB — POCT GLYCOSYLATED HEMOGLOBIN (HGB A1C): Hemoglobin A1C: 10.7 % — AB (ref 4.0–5.6)

## 2019-10-04 MED ORDER — TRESIBA FLEXTOUCH 200 UNIT/ML ~~LOC~~ SOPN: 210.0000 [IU] | PEN_INJECTOR | Freq: Every day | SUBCUTANEOUS | 3 refills | Status: DC

## 2019-10-04 NOTE — Patient Instructions (Addendum)
Your blood pressure is high today.  Please see your primary care provider soon, to have it rechecked I have sent a prescription to your pharmacy, to increase the [place], and: Please continue the same [place].  check your blood sugar twice a day.  vary the time of day when you check, between before the 3 meals, and at bedtime.  also check if you have symptoms of your blood sugar being too high or too low.  please keep a record of the readings and bring it to your next appointment here (or you can bring the meter itself).  You can write it on any piece of paper.  please call us sooner if your blood sugar goes below 70, or if you have a lot of readings over 200. Please come back for a follow-up appointment in 2 months.

## 2019-10-04 NOTE — Progress Notes (Signed)
Subjective:    Patient ID: Brian Sweeney, male    DOB: 06-Jun-1956, 64 y.o.   MRN: [id number]  HPI Pt returns for f/u of DM DM type: Insulin-requiring type 2 Dx'ed: 123456 Complications: polyneuropathy and PDR.  Therapy: insulin since 2018, and Januvia.   DKA: never Severe hypoglycemia: never.  Pancreatitis: never.  Pancreatic imaging: never.   Other: he did not tolerate metformin (diarrhea), Farxiga (diarrhea), Ozempic (nausea), or Trulicity (injection site rash); he declines multiple daily injections.   Interval history: He brings his meter with his cbg's which I have reviewed today.  cbg varies from 128-311.  It is in general higher as the day goes on.  pt states his diet is worse recently.   Past Medical History:  Diagnosis Date  . Allergic rhinitis   . Anxiety   . Chronic right shoulder pain   . Depression   . Hypercholesterolemia   . Hypertension   . Migraine   . Obesity, morbid ([place])   . Sleep apnea   . Type II diabetes mellitus, uncontrolled ([place])     Past Surgical History:  Procedure Laterality Date  . APPENDECTOMY    . ROTATOR CUFF REPAIR Right   . TUMOR REMOVAL      Social History   Socioeconomic History  . Marital status: Married    Spouse name: Not on file  . Number of children: Not on file  . Years of education: Not on file  . Highest education level: Not on file  Occupational History  . Not on file  Tobacco Use  . Smoking status: Former Research scientist (life sciences)  . Smokeless tobacco: Never Used  Substance and Sexual Activity  . Alcohol use: Not Currently  . Drug use: Not Currently  . Sexual activity: Not on file  Other Topics Concern  . Not on file  Social History Narrative  . Not on file   Social Determinants of Health   Financial Resource Strain:   . Difficulty of Paying Living Expenses: Not on file  Food Insecurity:   . Worried About Charity fundraiser in the Last Year: Not on file  . Ran Out of Food in the Last Year: Not on file  Transportation Needs:    . Lack of Transportation (Medical): Not on file  . Lack of Transportation (Non-Medical): Not on file  Physical Activity:   . Days of Exercise per Week: Not on file  . Minutes of Exercise per Session: Not on file  Stress:   . Feeling of Stress : Not on file  Social Connections:   . Frequency of Communication with Friends and Family: Not on file  . Frequency of Social Gatherings with Friends and Family: Not on file  . Attends Religious Services: Not on file  . Active Member of Clubs or Organizations: Not on file  . Attends Archivist Meetings: Not on file  . Marital Status: Not on file  Intimate Partner Violence:   . Fear of Current or Ex-Partner: Not on file  . Emotionally Abused: Not on file  . Physically Abused: Not on file  . Sexually Abused: Not on file    Current Outpatient Medications on File Prior to Visit  Medication Sig Dispense Refill  . cetirizine (ZYRTEC) 10 MG tablet Take 10 mg by mouth daily.    Marland Kitchen esomeprazole (NEXIUM) 20 MG capsule Take 20 mg by mouth daily at 12 noon.    . furosemide (LASIX) 40 MG tablet     . glucose  blood (ONETOUCH VERIO) test strip 1 each by Other route 2 (two) times daily. E11.9 100 each 2  . Olmesartan-amLODIPine-HCTZ (TRIBENZOR) 40-10-25 MG TABS daily.    . potassium chloride SA (K-DUR,KLOR-CON) 20 MEQ tablet     . sitaGLIPtin (JANUVIA) 100 MG tablet Take 1 tablet (100 mg total) by mouth daily. 30 tablet 11   No current facility-administered medications on file prior to visit.    Allergies  Allergen Reactions  . Invokana [Canagliflozin]   . Metformin And Related Diarrhea  . Trulicity [Dulaglutide]   . Bydureon [Exenatide] Rash  . Tanzeum [Albiglutide] Rash    Family History  Problem Relation Age of Onset  . Diabetes Mother     BP (!) 142/78 (BP Location: Left Arm, Patient Position: Sitting, Cuff Size: Large)   Pulse 95   Ht 5\' 5"  (1.651 m)   Wt (!) 301 lb 9.6 oz (136.8 kg)   SpO2 95%   BMI 50.19 kg/m    Review  of Systems He denies hypoglycemia.      Objective:   Physical Exam VITAL SIGNS:  See vs page GENERAL: no distress Pulses: dorsalis pedis intact bilat.   MSK: no deformity of the feet CV: 2+ bilat leg edema, and bilat vv's Skin:  no ulcer on the feet.  normal color and temp on the feet. Neuro: sensation is intact to touch on the feet.   Lab Results  Component Value Date   HGBA1C 10.7 (A) 10/04/2019       Assessment & Plan:  Insulin-requiring type 2 DM, with PDR: much worse.   HTN: is noted today Edema: This limits rx options  Patient Instructions  Your blood pressure is high today.  Please see your primary care provider soon, to have it rechecked I have sent a prescription to your pharmacy, to increase the [place], and: Please continue the same [place].  check your blood sugar twice a day.  vary the time of day when you check, between before the 3 meals, and at bedtime.  also check if you have symptoms of your blood sugar being too high or too low.  please keep a record of the readings and bring it to your next appointment here (or you can bring the meter itself).  You can write it on any piece of paper.  please call us sooner if your blood sugar goes below 70, or if you have a lot of readings over 200. Please come back for a follow-up appointment in 2 months.

## 2019-10-30 ENCOUNTER — Other Ambulatory Visit: Payer: Self-pay

## 2019-10-30 DIAGNOSIS — E113599 Type 2 diabetes mellitus with proliferative diabetic retinopathy without macular edema, unspecified eye: Secondary | ICD-10-CM

## 2019-10-30 DIAGNOSIS — Z794 Long term (current) use of insulin: Secondary | ICD-10-CM

## 2019-10-30 MED ORDER — ONETOUCH VERIO VI STRP: 1.0000 | ORAL_STRIP | Freq: Two times a day (BID) | 0 refills | Status: DC

## 2019-12-03 ENCOUNTER — Other Ambulatory Visit: Payer: Self-pay

## 2019-12-05 ENCOUNTER — Ambulatory Visit: Payer: BC Managed Care – PPO | Admitting: Endocrinology

## 2019-12-05 ENCOUNTER — Encounter: Payer: Self-pay | Admitting: Endocrinology

## 2019-12-05 ENCOUNTER — Other Ambulatory Visit: Payer: Self-pay

## 2019-12-05 VITALS — BP 134/70 | HR 85 | Ht 65.0 in | Wt 298.0 lb

## 2019-12-05 DIAGNOSIS — Z794 Long term (current) use of insulin: Secondary | ICD-10-CM

## 2019-12-05 DIAGNOSIS — E113599 Type 2 diabetes mellitus with proliferative diabetic retinopathy without macular edema, unspecified eye: Secondary | ICD-10-CM | POA: Diagnosis not present

## 2019-12-05 LAB — POCT GLYCOSYLATED HEMOGLOBIN (HGB A1C): Hemoglobin A1C: 10.2 % — AB (ref 4.0–5.6)

## 2019-12-05 MED ORDER — TRULICITY 0.75 MG/0.5ML ~~LOC~~ SOAJ: 0.7500 mg | SUBCUTANEOUS | 11 refills | Status: DC

## 2019-12-05 NOTE — Progress Notes (Signed)
Subjective:    Patient ID: Brian Sweeney, male    DOB: 1956-01-14, 64 y.o.   MRN: [id number]  HPI Pt returns for f/u of DM DM type: Insulin-requiring type 2 Dx'ed: 123456 Complications: polyneuropathy, CRI, and PDR.  Therapy: insulin since 2018, and Januvia.   DKA: never Severe hypoglycemia: never.  Pancreatitis: never.  Pancreatic imaging: never.   Other: he did not tolerate metformin (diarrhea), Farxiga (diarrhea), Ozempic (nausea), or Trulicity (injection site rash); he declines multiple daily injections; he has NASH, but he can't take pioglitazone (edema)  Interval history: He brings his meter with his cbg's which I have reviewed today.  cbg varies from 106-344.  It is in general higher as the day goes on.  pt states his diet is worse recently.   Past Medical History:  Diagnosis Date  . Allergic rhinitis   . Anxiety   . Chronic right shoulder pain   . Depression   . Hypercholesterolemia   . Hypertension   . Migraine   . Obesity, morbid ([place])   . Sleep apnea   . Type II diabetes mellitus, uncontrolled ([place])     Past Surgical History:  Procedure Laterality Date  . APPENDECTOMY    . ROTATOR CUFF REPAIR Right   . TUMOR REMOVAL      Social History   Socioeconomic History  . Marital status: Married    Spouse name: Not on file  . Number of children: Not on file  . Years of education: Not on file  . Highest education level: Not on file  Occupational History  . Not on file  Tobacco Use  . Smoking status: Former Research scientist (life sciences)  . Smokeless tobacco: Never Used  Substance and Sexual Activity  . Alcohol use: Not Currently  . Drug use: Not Currently  . Sexual activity: Not on file  Other Topics Concern  . Not on file  Social History Narrative  . Not on file   Social Determinants of Health   Financial Resource Strain:   . Difficulty of Paying Living Expenses:   Food Insecurity:   . Worried About Charity fundraiser in the Last Year:   . Arboriculturist in the Last Year:    Transportation Needs:   . Film/video editor (Medical):   Marland Kitchen Lack of Transportation (Non-Medical):   Physical Activity:   . Days of Exercise per Week:   . Minutes of Exercise per Session:   Stress:   . Feeling of Stress :   Social Connections:   . Frequency of Communication with Friends and Family:   . Frequency of Social Gatherings with Friends and Family:   . Attends Religious Services:   . Active Member of Clubs or Organizations:   . Attends Archivist Meetings:   Marland Kitchen Marital Status:   Intimate Partner Violence:   . Fear of Current or Ex-Partner:   . Emotionally Abused:   Marland Kitchen Physically Abused:   . Sexually Abused:     Current Outpatient Medications on File Prior to Visit  Medication Sig Dispense Refill  . cetirizine (ZYRTEC) 10 MG tablet Take 10 mg by mouth daily.    Marland Kitchen esomeprazole (NEXIUM) 20 MG capsule Take 20 mg by mouth daily at 12 noon.    . furosemide (LASIX) 40 MG tablet     . glucose blood (ONETOUCH VERIO) test strip 1 each by Other route 2 (two) times daily. E11.9 180 each 0  . insulin degludec (TRESIBA FLEXTOUCH) 200 UNIT/ML FlexTouch  Pen Inject 210 Units into the skin daily. 33 pen 3  . Olmesartan-amLODIPine-HCTZ (TRIBENZOR) 40-10-25 MG TABS daily.    . potassium chloride SA (K-DUR,KLOR-CON) 20 MEQ tablet     . sitaGLIPtin (JANUVIA) 100 MG tablet Take 1 tablet (100 mg total) by mouth daily. 30 tablet 11   No current facility-administered medications on file prior to visit.    Allergies  Allergen Reactions  . Invokana [Canagliflozin]   . Metformin And Related Diarrhea  . Trulicity [Dulaglutide]   . Bydureon [Exenatide] Rash  . Tanzeum [Albiglutide] Rash    Family History  Problem Relation Age of Onset  . Diabetes Mother     BP 134/70   Pulse 85   Ht 5\' 5"  (1.651 m)   Wt 298 lb (135.2 kg)   SpO2 97%   BMI 49.59 kg/m    Review of Systems He denies hypoglycemia.      Objective:   Physical Exam VITAL SIGNS:  See vs page GENERAL: no  distress Pulses: dorsalis pedis intact bilat.   MSK: no deformity of the feet CV: 1+ bilat leg edema, and bilat vv's.   Skin:  no ulcer on the feet.  normal color and temp on the feet. Neuro: sensation is intact to touch on the feet.    Lab Results  Component Value Date   HGBA1C 10.2 (A) 12/05/2019        Assessment & Plan:  Insulin-requiring type 2 DM, with PN Rash, on Trulicity, by hx.  We discussed.  Pt says this might have been a coincidence.  Edema: This limits rx options  Patient Instructions  I have sent a prescription to your pharmacy, to add back Trulicity, and:  Please continue the same [place].  check your blood sugar twice a day.  vary the time of day when you check, between before the 3 meals, and at bedtime.  also check if you have symptoms of your blood sugar being too high or too low.  please keep a record of the readings and bring it to your next appointment here (or you can bring the meter itself).  You can write it on any piece of paper.  please call us sooner if your blood sugar goes below 70, or if you have a lot of readings over 200. Please come back for a follow-up appointment in 2 months.

## 2019-12-05 NOTE — Patient Instructions (Addendum)
I have sent a prescription to your pharmacy, to add back Trulicity, and:  Please continue the same [place].  check your blood sugar twice a day.  vary the time of day when you check, between before the 3 meals, and at bedtime.  also check if you have symptoms of your blood sugar being too high or too low.  please keep a record of the readings and bring it to your next appointment here (or you can bring the meter itself).  You can write it on any piece of paper.  please call us sooner if your blood sugar goes below 70, or if you have a lot of readings over 200. Please come back for a follow-up appointment in 2 months.

## 2020-02-08 ENCOUNTER — Other Ambulatory Visit: Payer: Self-pay

## 2020-02-08 ENCOUNTER — Encounter: Payer: Self-pay | Admitting: Endocrinology

## 2020-02-08 ENCOUNTER — Ambulatory Visit: Payer: BC Managed Care – PPO | Admitting: Endocrinology

## 2020-02-08 VITALS — BP 114/74 | HR 70 | Ht 65.0 in | Wt 294.2 lb

## 2020-02-08 DIAGNOSIS — Z794 Long term (current) use of insulin: Secondary | ICD-10-CM | POA: Diagnosis not present

## 2020-02-08 DIAGNOSIS — E113599 Type 2 diabetes mellitus with proliferative diabetic retinopathy without macular edema, unspecified eye: Secondary | ICD-10-CM

## 2020-02-08 LAB — POCT GLYCOSYLATED HEMOGLOBIN (HGB A1C): Hemoglobin A1C: 7.8 % — AB (ref 4.0–5.6)

## 2020-02-08 NOTE — Progress Notes (Signed)
Subjective:    Patient ID: Brian Sweeney, male    DOB: 1956/06/26, 64 y.o.   MRN: [id number]  HPI Pt returns for f/u of DM DM type: Insulin-requiring type 2 Dx'ed: 5093 Complications: PN, CRI, and PDR.  Therapy: insulin since 2018, and Januvia.   DKA: never Severe hypoglycemia: never.  Pancreatitis: never.  Pancreatic imaging: never.   Other: he did not tolerate metformin (diarrhea), Farxiga (diarrhea), or Ozempic (nausea); he declines multiple daily injections; he has NASH, but he can't take pioglitazone (edema)  Interval history: He brings his meter with his cbg's which I have reviewed today.  cbg varies from 109-214.  He has mild nausea.   Past Medical History:  Diagnosis Date  . Allergic rhinitis   . Anxiety   . Chronic right shoulder pain   . Depression   . Hypercholesterolemia   . Hypertension   . Migraine   . Obesity, morbid ([place])   . Sleep apnea   . Type II diabetes mellitus, uncontrolled ([place])     Past Surgical History:  Procedure Laterality Date  . APPENDECTOMY    . ROTATOR CUFF REPAIR Right   . TUMOR REMOVAL      Social History   Socioeconomic History  . Marital status: Married    Spouse name: Not on file  . Number of children: Not on file  . Years of education: Not on file  . Highest education level: Not on file  Occupational History  . Not on file  Tobacco Use  . Smoking status: Former Research scientist (life sciences)  . Smokeless tobacco: Never Used  Substance and Sexual Activity  . Alcohol use: Not Currently  . Drug use: Not Currently  . Sexual activity: Not on file  Other Topics Concern  . Not on file  Social History Narrative  . Not on file   Social Determinants of Health   Financial Resource Strain:   . Difficulty of Paying Living Expenses:   Food Insecurity:   . Worried About Charity fundraiser in the Last Year:   . Arboriculturist in the Last Year:   Transportation Needs:   . Film/video editor (Medical):   Marland Kitchen Lack of Transportation (Non-Medical):    Physical Activity:   . Days of Exercise per Week:   . Minutes of Exercise per Session:   Stress:   . Feeling of Stress :   Social Connections:   . Frequency of Communication with Friends and Family:   . Frequency of Social Gatherings with Friends and Family:   . Attends Religious Services:   . Active Member of Clubs or Organizations:   . Attends Archivist Meetings:   Marland Kitchen Marital Status:   Intimate Partner Violence:   . Fear of Current or Ex-Partner:   . Emotionally Abused:   Marland Kitchen Physically Abused:   . Sexually Abused:     Current Outpatient Medications on File Prior to Visit  Medication Sig Dispense Refill  . cetirizine (ZYRTEC) 10 MG tablet Take 10 mg by mouth daily.    . Dulaglutide (TRULICITY) 2.67 TI/4.5YK SOPN Inject 0.75 mg into the skin once a week. 4 pen 11  . esomeprazole (NEXIUM) 20 MG capsule Take 20 mg by mouth daily at 12 noon.    . furosemide (LASIX) 40 MG tablet     . glucose blood (ONETOUCH VERIO) test strip 1 each by Other route 2 (two) times daily. E11.9 180 each 0  . insulin degludec (TRESIBA FLEXTOUCH) 200 UNIT/ML  FlexTouch Pen Inject 210 Units into the skin daily. 33 pen 3  . Olmesartan-amLODIPine-HCTZ (TRIBENZOR) 40-10-25 MG TABS daily.    . potassium chloride SA (K-DUR,KLOR-CON) 20 MEQ tablet     . sitaGLIPtin (JANUVIA) 100 MG tablet Take 1 tablet (100 mg total) by mouth daily. 30 tablet 11   No current facility-administered medications on file prior to visit.    Allergies  Allergen Reactions  . Invokana [Canagliflozin]   . Metformin And Related Diarrhea  . Trulicity [Dulaglutide]   . Bydureon [Exenatide] Rash  . Tanzeum [Albiglutide] Rash    Family History  Problem Relation Age of Onset  . Diabetes Mother     BP 114/74   Pulse 70   Ht 5\' 5"  (1.651 m)   Wt 294 lb 3.2 oz (133.4 kg)   SpO2 98%   BMI 48.96 kg/m   Review of Systems He denies hypoglycemia.      Objective:   Physical Exam VITAL SIGNS:  See vs page GENERAL: no  distress Pulses: dorsalis pedis intact bilat.   MSK: no deformity of the feet.  CV: 2+ bilat leg edema, and bilat vv's.   Skin:  no ulcer on the feet.  normal color and temp on the feet.  Neuro: sensation is intact to touch on the feet.    Lab Results  Component Value Date   HGBA1C 7.8 (A) 02/08/2020    Lab Results  Component Value Date   CREATININE 1.2 05/23/2019   BUN 22 (A) 05/23/2019   NA 141 02/01/2018   K 2.9 (L) 02/01/2018   CL 99 02/01/2018   CO2 31 02/01/2018        Assessment & Plan:  Insulin-requiring type 2 DM, with CRI: this is the best control this pt should aim for, given this regimen, which does match insulin to his changing needs throughout the day Nausea, due to Januvia: he wants to continue.  Patient Instructions  Please continue the same medications. check your blood sugar twice a day.  vary the time of day when you check, between before the 3 meals, and at bedtime.  also check if you have symptoms of your blood sugar being too high or too low.  please keep a record of the readings and bring it to your next appointment here (or you can bring the meter itself).  You can write it on any piece of paper.  please call us sooner if your blood sugar goes below 70, or if you have a lot of readings over 200. Please come back for a follow-up appointment in 2 months.

## 2020-02-08 NOTE — Patient Instructions (Addendum)
Please continue the same medications. check your blood sugar twice a day.  vary the time of day when you check, between before the 3 meals, and at bedtime.  also check if you have symptoms of your blood sugar being too high or too low.  please keep a record of the readings and bring it to your next appointment here (or you can bring the meter itself).  You can write it on any piece of paper.  please call us sooner if your blood sugar goes below 70, or if you have a lot of readings over 200. Please come back for a follow-up appointment in 2 months.

## 2020-02-13 ENCOUNTER — Other Ambulatory Visit: Payer: Self-pay

## 2020-02-13 DIAGNOSIS — Z794 Long term (current) use of insulin: Secondary | ICD-10-CM

## 2020-02-13 MED ORDER — SITAGLIPTIN PHOSPHATE 100 MG PO TABS: 100.0000 mg | ORAL_TABLET | Freq: Every day | ORAL | 11 refills | Status: DC

## 2020-02-17 ENCOUNTER — Other Ambulatory Visit: Payer: Self-pay | Admitting: Endocrinology

## 2020-02-17 DIAGNOSIS — E119 Type 2 diabetes mellitus without complications: Secondary | ICD-10-CM

## 2020-04-16 ENCOUNTER — Ambulatory Visit: Payer: BC Managed Care – PPO | Admitting: Endocrinology

## 2020-04-16 ENCOUNTER — Other Ambulatory Visit: Payer: Self-pay

## 2020-04-16 ENCOUNTER — Encounter: Payer: Self-pay | Admitting: Endocrinology

## 2020-04-16 VITALS — BP 142/70 | HR 87 | Ht 65.0 in | Wt 294.0 lb

## 2020-04-16 DIAGNOSIS — Z794 Long term (current) use of insulin: Secondary | ICD-10-CM | POA: Diagnosis not present

## 2020-04-16 DIAGNOSIS — E113599 Type 2 diabetes mellitus with proliferative diabetic retinopathy without macular edema, unspecified eye: Secondary | ICD-10-CM | POA: Diagnosis not present

## 2020-04-16 LAB — POCT GLYCOSYLATED HEMOGLOBIN (HGB A1C): Hemoglobin A1C: 7.6 % — AB (ref 4.0–5.6)

## 2020-04-16 NOTE — Patient Instructions (Addendum)
Your blood pressure is high today.  Please see your primary care provider soon, to have it rechecked Please continue the same medications. check your blood sugar twice a day.  vary the time of day when you check, between before the 3 meals, and at bedtime.  also check if you have symptoms of your blood sugar being too high or too low.  please keep a record of the readings and bring it to your next appointment here (or you can bring the meter itself).  You can write it on any piece of paper.  please call us sooner if your blood sugar goes below 70, or if you have a lot of readings over 200. Please come back for a follow-up appointment in 3 months.

## 2020-04-16 NOTE — Progress Notes (Signed)
Subjective:    Patient ID: Brian Sweeney, male    DOB: September 17, 1955, 64 y.o.   MRN: [id number]  HPI Pt returns for f/u of DM DM type: Insulin-requiring type 2 Dx'ed: 2951 Complications: PN, CRI, and PDR.  Therapy: insulin since 8841, Trulicity, and Januvia.   DKA: never Severe hypoglycemia: never.  Pancreatitis: never.  Pancreatic imaging: never.   Other: he did not tolerate metformin (diarrhea), Farxiga (diarrhea), or Ozempic (nausea); he declines multiple daily injections; he has NASH, but he can't take pioglitazone (edema).   Interval history: He brings his meter with his cbg's which I have reviewed today.  cbg varies from 74-208.  There is no trend throughout the day.  He has mild nausea.   Past Medical History:  Diagnosis Date  . Allergic rhinitis   . Anxiety   . Chronic right shoulder pain   . Depression   . Hypercholesterolemia   . Hypertension   . Migraine   . Obesity, morbid ([place])   . Sleep apnea   . Type II diabetes mellitus, uncontrolled ([place])     Past Surgical History:  Procedure Laterality Date  . APPENDECTOMY    . ROTATOR CUFF REPAIR Right   . TUMOR REMOVAL      Social History   Socioeconomic History  . Marital status: Married    Spouse name: Not on file  . Number of children: Not on file  . Years of education: Not on file  . Highest education level: Not on file  Occupational History  . Not on file  Tobacco Use  . Smoking status: Former Research scientist (life sciences)  . Smokeless tobacco: Never Used  Substance and Sexual Activity  . Alcohol use: Not Currently  . Drug use: Not Currently  . Sexual activity: Not on file  Other Topics Concern  . Not on file  Social History Narrative  . Not on file   Social Determinants of Health   Financial Resource Strain:   . Difficulty of Paying Living Expenses: Not on file  Food Insecurity:   . Worried About Charity fundraiser in the Last Year: Not on file  . Ran Out of Food in the Last Year: Not on file  Transportation Needs:    . Lack of Transportation (Medical): Not on file  . Lack of Transportation (Non-Medical): Not on file  Physical Activity:   . Days of Exercise per Week: Not on file  . Minutes of Exercise per Session: Not on file  Stress:   . Feeling of Stress : Not on file  Social Connections:   . Frequency of Communication with Friends and Family: Not on file  . Frequency of Social Gatherings with Friends and Family: Not on file  . Attends Religious Services: Not on file  . Active Member of Clubs or Organizations: Not on file  . Attends Archivist Meetings: Not on file  . Marital Status: Not on file  Intimate Partner Violence:   . Fear of Current or Ex-Partner: Not on file  . Emotionally Abused: Not on file  . Physically Abused: Not on file  . Sexually Abused: Not on file    Current Outpatient Medications on File Prior to Visit  Medication Sig Dispense Refill  . cetirizine (ZYRTEC) 10 MG tablet Take 10 mg by mouth daily.    . Dulaglutide (TRULICITY) 6.60 YT/0.1SW SOPN Inject 0.75 mg into the skin once a week. 4 pen 11  . esomeprazole (NEXIUM) 20 MG capsule Take 20 mg by  mouth daily at 12 noon.    . furosemide (LASIX) 40 MG tablet     . glucose blood (ONETOUCH VERIO) test strip 1 each by Other route 2 (two) times daily. E11.9 180 each 0  . Olmesartan-amLODIPine-HCTZ (TRIBENZOR) 40-10-25 MG TABS daily.    . potassium chloride SA (K-DUR,KLOR-CON) 20 MEQ tablet     . sitaGLIPtin (JANUVIA) 100 MG tablet Take 1 tablet (100 mg total) by mouth daily. 30 tablet 11  . TRESIBA FLEXTOUCH 200 UNIT/ML FlexTouch Pen INJECT 180 UNITS INTO THE SKIN DAILY AS DIRECTED 81 mL 0   No current facility-administered medications on file prior to visit.    Allergies  Allergen Reactions  . Invokana [Canagliflozin]   . Metformin And Related Diarrhea  . Trulicity [Dulaglutide]   . Bydureon [Exenatide] Rash  . Tanzeum [Albiglutide] Rash    Family History  Problem Relation Age of Onset  . Diabetes Mother      BP (!) 142/70   Pulse 87   Ht 5\' 5"  (1.651 m)   Wt 294 lb (133.4 kg)   SpO2 95%   BMI 48.92 kg/m    Review of Systems He denies hypoglycemia.      Objective:   Physical Exam VITAL SIGNS:  See vs page GENERAL: no distress Pulses: dorsalis pedis intact bilat.   MSK: no deformity of the feet CV: 1+ bilat leg edema, and bilat VV's Skin:  no ulcer on the feet.  normal color and temp on the feet. Neuro: sensation is intact to touch on the feet  A1c=7.6%  Lab Results  Component Value Date   CREATININE 1.2 05/23/2019   BUN 22 (A) 05/23/2019   NA 141 02/01/2018   K 2.9 (L) 02/01/2018   CL 99 02/01/2018   CO2 31 02/01/2018       Assessment & Plan:  Insulin-requiring type 2 DM, with CRI.  I advised him to consolidate Januvia and Trulicity.  He declines for now. HTN: is noted today. Nausea, due to Trulicity: we can't increase for now  Patient Instructions  Your blood pressure is high today.  Please see your primary care provider soon, to have it rechecked Please continue the same medications. check your blood sugar twice a day.  vary the time of day when you check, between before the 3 meals, and at bedtime.  also check if you have symptoms of your blood sugar being too high or too low.  please keep a record of the readings and bring it to your next appointment here (or you can bring the meter itself).  You can write it on any piece of paper.  please call us sooner if your blood sugar goes below 70, or if you have a lot of readings over 200. Please come back for a follow-up appointment in 3 months.

## 2020-05-05 ENCOUNTER — Other Ambulatory Visit: Payer: Self-pay | Admitting: Endocrinology

## 2020-07-04 ENCOUNTER — Encounter: Payer: Self-pay | Admitting: Endocrinology

## 2020-07-16 ENCOUNTER — Other Ambulatory Visit: Payer: Self-pay

## 2020-07-16 ENCOUNTER — Encounter: Payer: Self-pay | Admitting: Endocrinology

## 2020-07-16 ENCOUNTER — Ambulatory Visit: Payer: BC Managed Care – PPO | Admitting: Endocrinology

## 2020-07-16 VITALS — BP 120/80 | HR 84 | Ht 65.0 in | Wt 289.6 lb

## 2020-07-16 DIAGNOSIS — Z794 Long term (current) use of insulin: Secondary | ICD-10-CM | POA: Diagnosis not present

## 2020-07-16 DIAGNOSIS — E113599 Type 2 diabetes mellitus with proliferative diabetic retinopathy without macular edema, unspecified eye: Secondary | ICD-10-CM

## 2020-07-16 LAB — POCT GLYCOSYLATED HEMOGLOBIN (HGB A1C): Hemoglobin A1C: 10 % — AB (ref 4.0–5.6)

## 2020-07-16 MED ORDER — TOUJEO MAX SOLOSTAR 300 UNIT/ML ~~LOC~~ SOPN: 180.0000 [IU] | PEN_INJECTOR | SUBCUTANEOUS | 11 refills | Status: DC

## 2020-07-16 NOTE — Patient Instructions (Addendum)
I have sent a prescription to your pharmacy, to start Toujeo Please continue the same Januvia.   check your blood sugar twice a day.  vary the time of day when you check, between before the 3 meals, and at bedtime.  also check if you have symptoms of your blood sugar being too high or too low.  please keep a record of the readings and bring it to your next appointment here (or you can bring the meter itself).  You can write it on any piece of paper.  please call us sooner if your blood sugar goes below 70, or if you have a lot of readings over 200.   Please come back for a follow-up appointment in 2 months.

## 2020-07-16 NOTE — Progress Notes (Signed)
Subjective:    Patient ID: Brian Sweeney, male    DOB: 07/19/1956, 64 y.o.   MRN: [id number]  HPI Pt returns for f/u of DM DM type: Insulin-requiring type 2 Dx'ed: 7858 Complications: PN, CRI, and PDR.  Therapy: insulin since 2018, and Januvia.   DKA: never Severe hypoglycemia: never.  Pancreatitis: never.  Pancreatic imaging: never.   Other: he did not tolerate metformin (diarrhea), Farxiga (diarrhea), or Ozempic (nausea); he declines multiple daily injections; he has NASH, but he can't take pioglitazone (edema).   Interval history: He brings his meter with his cbg's which I have reviewed today.  cbg varies from 80-208.  There is no trend throughout the day.  Diarrhea persists, but no nausea.  He stopped all meds except for Januvia, and diarrhea resolved.   Past Medical History:  Diagnosis Date  . Allergic rhinitis   . Anxiety   . Chronic right shoulder pain   . Depression   . Hypercholesterolemia   . Hypertension   . Migraine   . Obesity, morbid ([place])   . Sleep apnea   . Type II diabetes mellitus, uncontrolled ([place])     Past Surgical History:  Procedure Laterality Date  . APPENDECTOMY    . ROTATOR CUFF REPAIR Right   . TUMOR REMOVAL      Social History   Socioeconomic History  . Marital status: Married    Spouse name: Not on file  . Number of children: Not on file  . Years of education: Not on file  . Highest education level: Not on file  Occupational History  . Not on file  Tobacco Use  . Smoking status: Former Research scientist (life sciences)  . Smokeless tobacco: Never Used  Substance and Sexual Activity  . Alcohol use: Not Currently  . Drug use: Not Currently  . Sexual activity: Not on file  Other Topics Concern  . Not on file  Social History Narrative  . Not on file   Social Determinants of Health   Financial Resource Strain: Not on file  Food Insecurity: Not on file  Transportation Needs: Not on file  Physical Activity: Not on file  Stress: Not on file  Social  Connections: Not on file  Intimate Partner Violence: Not on file    Current Outpatient Medications on File Prior to Visit  Medication Sig Dispense Refill  . BD PEN NEEDLE NANO U/F 32G X 4 MM MISC USE TO INJECT TWICE A DAY 100 each 11  . cetirizine (ZYRTEC) 10 MG tablet Take 10 mg by mouth daily.    Marland Kitchen esomeprazole (NEXIUM) 20 MG capsule Take 20 mg by mouth daily at 12 noon.    . furosemide (LASIX) 40 MG tablet     . glucose blood (ONETOUCH VERIO) test strip 1 each by Other route 2 (two) times daily. E11.9 180 each 0  . Olmesartan-amLODIPine-HCTZ 40-10-25 MG TABS daily.    . potassium chloride SA (K-DUR,KLOR-CON) 20 MEQ tablet     . sitaGLIPtin (JANUVIA) 100 MG tablet Take 1 tablet (100 mg total) by mouth daily. 30 tablet 11   No current facility-administered medications on file prior to visit.    Allergies  Allergen Reactions  . Invokana [Canagliflozin]   . Metformin And Related Diarrhea  . Trulicity [Dulaglutide]   . Bydureon [Exenatide] Rash  . Tanzeum [Albiglutide] Rash    Family History  Problem Relation Age of Onset  . Diabetes Mother     BP 120/80   Pulse 84   Ht  5\' 5"  (1.651 m)   Wt 289 lb 9.6 oz (131.4 kg)   SpO2 94%   BMI 48.19 kg/m    Review of Systems He denies hypoglycemia    Objective:   Physical Exam VITAL SIGNS:  See vs page GENERAL: no distress Pulses: dorsalis pedis intact bilat.   MSK: no deformity of the feet.   CV: 1+ bilat leg edema, and bilat VV's Skin:  no ulcer on the feet.  normal color and temp on the feet.  Neuro: sensation is intact to touch on the feet.    A1c=10.0%     Assessment & Plan:  Insulin-requiring type 2 DM, with CRI: uncontrolled.  Diarrhea, due to medication  Patient Instructions  I have sent a prescription to your pharmacy, to start Toujeo Please continue the same Januvia.   check your blood sugar twice a day.  vary the time of day when you check, between before the 3 meals, and at bedtime.  also check if you  have symptoms of your blood sugar being too high or too low.  please keep a record of the readings and bring it to your next appointment here (or you can bring the meter itself).  You can write it on any piece of paper.  please call us sooner if your blood sugar goes below 70, or if you have a lot of readings over 200.   Please come back for a follow-up appointment in 2 months.

## 2020-08-13 ENCOUNTER — Ambulatory Visit (INDEPENDENT_AMBULATORY_CARE_PROVIDER_SITE_OTHER): Payer: BC Managed Care – PPO | Admitting: Orthopedic Surgery

## 2020-08-13 ENCOUNTER — Ambulatory Visit: Payer: Self-pay

## 2020-08-13 ENCOUNTER — Other Ambulatory Visit: Payer: Self-pay

## 2020-08-13 DIAGNOSIS — M79601 Pain in right arm: Secondary | ICD-10-CM

## 2020-08-13 DIAGNOSIS — Z9889 Other specified postprocedural states: Secondary | ICD-10-CM

## 2020-08-17 ENCOUNTER — Encounter: Payer: Self-pay | Admitting: Orthopedic Surgery

## 2020-08-17 NOTE — Progress Notes (Signed)
Office Visit Note   Patient: Brian Sweeney           Date of Birth: March 31, 1956           MRN: [id number] Visit Date: 08/13/2020 Requested by: Mayra Neer, MD [address] [place],  [place] [address] PCP: Mayra Neer, MD  Subjective: Chief Complaint  Patient presents with  . Right Shoulder - Pain  . Neck - Pain    HPI: Brian Sweeney is a 65 y.o. male who presents to the office complaining of right arm and shoulder pain.  Patient fell 2 days ago with his arm extended but caught himself with his outstretched hand.  He notes pain since the fall that has been rapidly improving.  He denies feeling a pop or hearing a pop.  He does have a history of prior surgery to the right shoulder by Dr. Marlou Sa in 2007 that he describes as a rotator cuff repair.  He does note that the pain is woken him up the first night but did not wake him up last night.  Most of his pain is superior and anterior in the right shoulder.  Denies any neck pain.  He did have numbness and tingling initially but none currently.  He takes Advil as needed for pain.  At this time he feels he is "back to his baseline".  He has to lift cartons of paper for work..                ROS: All systems reviewed are negative as they relate to the chief complaint within the history of present illness.  Patient denies fevers or chills.  Assessment & Plan: Visit Diagnoses:  1. Right arm pain   2. History of repair of right rotator cuff     Plan: Patient is a 65 year old male who presents for evaluation of right shoulder pain following an injury 2 days ago.  Pain has been rapidly improving and he states that he is "back to his baseline".  Radiographs are negative for any acute pathology.  He does have history of prior rotator cuff repair of the right shoulder back in 2007 that seems to be a subscapularis repair based on his incision and description.  He has a very small amount of weakness of the subscap compared to the  contralateral side but nothing that is very concerning.  Overall, with his rapid improvement, no significant concern for reinjury to the rotator cuff at this time.  Plan for patient to follow-up as needed but encouraged him to return in 4 weeks if he feels he has not made 100% recovery.  Patient agreed with this plan.  Follow-Up Instructions: No follow-ups on file.   Orders:  Orders Placed This Encounter  Procedures  . XR Shoulder Right  . XR Cervical Spine 2 or 3 views   No orders of the defined types were placed in this encounter.     Procedures: No procedures performed   Clinical Data: No additional findings.  Objective: Vital Signs: There were no vitals taken for this visit.  Physical Exam:  Constitutional: Patient appears well-developed HEENT:  Head: Normocephalic Eyes:EOM are normal Neck: Normal range of motion Cardiovascular: Normal rate Pulmonary/chest: Effort normal Neurologic: Patient is alert Skin: Skin is warm Psychiatric: Patient has normal mood and affect  Ortho Exam: Ortho exam demonstrates right shoulder with 30 degrees external rotation, 90 degrees abduction, 160 degrees forward flexion.  He has active forward flexion to about 130  degrees.  He has excellent external rotation strength and supraspinatus strength.  He has 5 -/5 strength of the subscapularis which is very slightly weaker than the contralateral side.  He has an anterior incision over the right shoulder that is well-healed from his surgery 15 years ago.  No tenderness throughout the axial cervical spine.  Negative Spurling sign.  5/5 motor strength of bilateral grip strength, finger abduction, pronation/supination, bicep, tricep, deltoid.  Specialty Comments:  No specialty comments available.  Imaging: No results found.   PMFS History: Patient Active Problem List   Diagnosis Date Noted  . Diabetes ([place]) 02/01/2018  . Multinodular goiter 02/01/2018   Past Medical History:  Diagnosis Date   . Allergic rhinitis   . Anxiety   . Chronic right shoulder pain   . Depression   . Hypercholesterolemia   . Hypertension   . Migraine   . Obesity, morbid ([place])   . Sleep apnea   . Type II diabetes mellitus, uncontrolled ([place])     Family History  Problem Relation Age of Onset  . Diabetes Mother     Past Surgical History:  Procedure Laterality Date  . APPENDECTOMY    . ROTATOR CUFF REPAIR Right   . TUMOR REMOVAL     Social History   Occupational History  . Not on file  Tobacco Use  . Smoking status: Former Research scientist (life sciences)  . Smokeless tobacco: Never Used  Substance and Sexual Activity  . Alcohol use: Not Currently  . Drug use: Not Currently  . Sexual activity: Not on file

## 2020-08-20 ENCOUNTER — Encounter: Payer: Self-pay | Admitting: Orthopedic Surgery

## 2020-09-15 ENCOUNTER — Telehealth: Payer: Self-pay | Admitting: Endocrinology

## 2020-09-15 ENCOUNTER — Encounter: Payer: Self-pay | Admitting: Endocrinology

## 2020-09-15 ENCOUNTER — Telehealth (INDEPENDENT_AMBULATORY_CARE_PROVIDER_SITE_OTHER): Payer: BC Managed Care – PPO | Admitting: Endocrinology

## 2020-09-15 ENCOUNTER — Other Ambulatory Visit: Payer: Self-pay

## 2020-09-15 VITALS — Ht 65.0 in | Wt 290.0 lb

## 2020-09-15 DIAGNOSIS — Z794 Long term (current) use of insulin: Secondary | ICD-10-CM | POA: Diagnosis not present

## 2020-09-15 DIAGNOSIS — E113599 Type 2 diabetes mellitus with proliferative diabetic retinopathy without macular edema, unspecified eye: Secondary | ICD-10-CM

## 2020-09-15 MED ORDER — TOUJEO MAX SOLOSTAR 300 UNIT/ML ~~LOC~~ SOPN
350.0000 [IU] | PEN_INJECTOR | SUBCUTANEOUS | 3 refills | Status: DC
Start: 2020-09-15 — End: 2020-10-23

## 2020-09-15 NOTE — Patient Instructions (Addendum)
I have sent a prescription to your pharmacy, to increase the insulin to 350 units each morning, and: Please continue the same Januvia.   check your blood sugar twice a day.  vary the time of day when you check, between before the 3 meals, and at bedtime.  also check if you have symptoms of your blood sugar being too high or too low.  please keep a record of the readings and bring it to your next appointment here (or you can bring the meter itself).  You can write it on any piece of paper.  please call us sooner if your blood sugar goes below 70, or if you have a lot of readings over 200.   Please come back for a follow-up appointment in 1 month.

## 2020-09-15 NOTE — Progress Notes (Signed)
Subjective:    Patient ID: Brian Sweeney, male    DOB: [birth year], 65 y.o.   MRN: [id number]  HPI  telehealth visit today via video visit.  Alternatives to telehealth are presented to this patient, and the patient agrees to the telehealth visit. Pt is advised of the cost of the visit, and agrees to this, also.   Patient is at home, and I am at home.   Persons attending the telehealth visit: the patient and I Pt returns for f/u of DM DM type: Insulin-requiring type 2 Dx'ed: 7253 Complications: PN, CRI, and PDR.  Therapy: insulin since 2018, and Januvia.   DKA: never Severe hypoglycemia: never.  Pancreatitis: never.  Pancreatic imaging: never.   Other: he did not tolerate metformin (diarrhea), Farxiga (diarrhea), or Ozempic (nausea); he declines multiple daily injections; he has NASH, but he can't take pioglitazone (edema).   Interval history: pt says cbg varies from 198-268.  There is no trend throughout the day.  Denies diarrhea.  He has regained the weight he lost with recent illness.  1 week ago, he increasd Toujeo to 320 units/d.  Aside from reduced exercise, he is uncertain why insulin need has increased.  Denies insulin inject probs.   Past Medical History:  Diagnosis Date   Allergic rhinitis    Anxiety    Chronic right shoulder pain    Depression    Hypercholesterolemia    Hypertension    Migraine    Obesity, morbid ([facility])    Sleep apnea    Type II diabetes mellitus, uncontrolled ([place])     Past Surgical History:  Procedure Laterality Date   APPENDECTOMY     ROTATOR CUFF REPAIR Right    TUMOR REMOVAL      Social History   Socioeconomic History   Marital status: Married    Spouse name: Not on file   Number of children: Not on file   Years of education: Not on file   Highest education level: Not on file  Occupational History   Not on file  Tobacco Use   Smoking status: Former Smoker   Smokeless tobacco: Never Used  Substance and Sexual  Activity   Alcohol use: Not Currently   Drug use: Not Currently   Sexual activity: Not on file  Other Topics Concern   Not on file  Social History Narrative   Not on file   Social Determinants of Health   Financial Resource Strain: Not on file  Food Insecurity: Not on file  Transportation Needs: Not on file  Physical Activity: Not on file  Stress: Not on file  Social Connections: Not on file  Intimate Partner Violence: Not on file    Current Outpatient Medications on File Prior to Visit  Medication Sig Dispense Refill   BD PEN NEEDLE NANO U/F 32G X 4 MM MISC USE TO INJECT TWICE A DAY 100 each 11   cetirizine (ZYRTEC) 10 MG tablet Take 10 mg by mouth daily.     esomeprazole (NEXIUM) 20 MG capsule Take 20 mg by mouth daily at 12 noon.     furosemide (LASIX) 40 MG tablet      glucose blood (ONETOUCH VERIO) test strip 1 each by Other route 2 (two) times daily. E11.9 180 each 0   Olmesartan-amLODIPine-HCTZ 40-10-25 MG TABS daily.     potassium chloride SA (K-DUR,KLOR-CON) 20 MEQ tablet      sitaGLIPtin (JANUVIA) 100 MG tablet Take 1 tablet (100 mg total) by mouth daily. [place]  tablet 11   No current facility-administered medications on file prior to visit.    Allergies  Allergen Reactions   Invokana [Canagliflozin]    Metformin And Related Diarrhea   Trulicity [Dulaglutide]    Bydureon [Exenatide] Rash   Tanzeum [Albiglutide] Rash    Family History  Problem Relation Age of Onset   Diabetes Mother     Ht 5\' 5"  (1.651 m)    Wt 290 lb (131.5 kg)    BMI 48.26 kg/m    Review of Systems He denies hypoglycemia.      Objective:   Physical Exam      Assessment & Plan:  Insulin-requiring type 2 DM, with CRI: uncontrolled.  Uncertain reason why insulin need has increased.    Patient Instructions  I have sent a prescription to your pharmacy, to increase the insulin to 350 units each morning, and: Please continue the same Januvia.   check your blood  sugar twice a day.  vary the time of day when you check, between before the 3 meals, and at bedtime.  also check if you have symptoms of your blood sugar being too high or too low.  please keep a record of the readings and bring it to your next appointment here (or you can bring the meter itself).  You can write it on any piece of paper.  please call us sooner if your blood sugar goes below 70, or if you have a lot of readings over 200.   Please come back for a follow-up appointment in 1 month.

## 2020-09-15 NOTE — Telephone Encounter (Signed)
Pharmacy called asking for a nurse to give them a call regarding the dosage for pt. They states it says inject 350 units but the pharmacy says last time he was on 180 units so they want to speak to a nurse to clarify things.  [facility]# [phone]

## 2020-09-16 NOTE — Telephone Encounter (Signed)
Called [facility]--confirm Rx toujeo  350 units every morning by last note of Dr. Loanne Drilling

## 2020-09-17 ENCOUNTER — Ambulatory Visit: Payer: BC Managed Care – PPO | Admitting: Endocrinology

## 2020-10-23 ENCOUNTER — Ambulatory Visit: Payer: BC Managed Care – PPO | Admitting: Endocrinology

## 2020-10-23 ENCOUNTER — Other Ambulatory Visit: Payer: Self-pay

## 2020-10-23 VITALS — BP 140/60 | HR 83 | Ht 68.0 in | Wt 292.4 lb

## 2020-10-23 DIAGNOSIS — Z794 Long term (current) use of insulin: Secondary | ICD-10-CM

## 2020-10-23 DIAGNOSIS — E113599 Type 2 diabetes mellitus with proliferative diabetic retinopathy without macular edema, unspecified eye: Secondary | ICD-10-CM | POA: Diagnosis not present

## 2020-10-23 LAB — POCT GLYCOSYLATED HEMOGLOBIN (HGB A1C): Hemoglobin A1C: 8.7 % — AB (ref 4.0–5.6)

## 2020-10-23 MED ORDER — TOUJEO MAX SOLOSTAR 300 UNIT/ML ~~LOC~~ SOPN
370.0000 [IU] | PEN_INJECTOR | SUBCUTANEOUS | 3 refills | Status: DC
Start: 2020-10-23 — End: 2021-02-09

## 2020-10-23 NOTE — Patient Instructions (Addendum)
I have sent a prescription to your pharmacy, to increase the insulin to 370 units each morning (but subtract 50 if you are going to be active), and: Please continue the same Januvia.   check your blood sugar twice a day.  vary the time of day when you check, between before the 3 meals, and at bedtime.  also check if you have symptoms of your blood sugar being too high or too low.  please keep a record of the readings and bring it to your next appointment here (or you can bring the meter itself).  You can write it on any piece of paper.  please call us sooner if your blood sugar goes below 70, or if you have a lot of readings over 200.   Please come back for a follow-up appointment in 2 months.

## 2020-10-23 NOTE — Progress Notes (Signed)
Subjective:    Patient ID: Brian Sweeney, male    DOB: 1955/09/19, 65 y.o.   MRN: [id number]  HPI Pt returns for f/u of DM DM type: Insulin-requiring type 2 Dx'ed: 2694 Complications: PN, CRI, and PDR.  Therapy: insulin since 2018, and Januvia.   DKA: never Severe hypoglycemia: never.  Pancreatitis: never.  Pancreatic imaging: never.   Other: he did not tolerate metformin (diarrhea), Farxiga (diarrhea), or Ozempic (nausea); he declines multiple daily injections; he has NASH, but he can't take pioglitazone (edema).   Interval history: He brings his meter with his cbg's which I have reviewed today.  cbg varies from 63-251. It is in general lowest in the afternoon.  Pt say this is due to activity.  Pt says he never misses the insulin.   Past Medical History:  Diagnosis Date  . Allergic rhinitis   . Anxiety   . Chronic right shoulder pain   . Depression   . Hypercholesterolemia   . Hypertension   . Migraine   . Obesity, morbid ([place])   . Sleep apnea   . Type II diabetes mellitus, uncontrolled ([place])     Past Surgical History:  Procedure Laterality Date  . APPENDECTOMY    . ROTATOR CUFF REPAIR Right   . TUMOR REMOVAL      Social History   Socioeconomic History  . Marital status: Married    Spouse name: Not on file  . Number of children: Not on file  . Years of education: Not on file  . Highest education level: Not on file  Occupational History  . Not on file  Tobacco Use  . Smoking status: Former Research scientist (life sciences)  . Smokeless tobacco: Never Used  Substance and Sexual Activity  . Alcohol use: Not Currently  . Drug use: Not Currently  . Sexual activity: Not on file  Other Topics Concern  . Not on file  Social History Narrative  . Not on file   Social Determinants of Health   Financial Resource Strain: Not on file  Food Insecurity: Not on file  Transportation Needs: Not on file  Physical Activity: Not on file  Stress: Not on file  Social Connections: Not on file   Intimate Partner Violence: Not on file    Current Outpatient Medications on File Prior to Visit  Medication Sig Dispense Refill  . BD PEN NEEDLE NANO U/F 32G X 4 MM MISC USE TO INJECT TWICE A DAY 100 each 11  . cetirizine (ZYRTEC) 10 MG tablet Take 10 mg by mouth daily.    Marland Kitchen esomeprazole (NEXIUM) 20 MG capsule Take 20 mg by mouth daily at 12 noon.    . furosemide (LASIX) 40 MG tablet     . glucose blood (ONETOUCH VERIO) test strip 1 each by Other route 2 (two) times daily. E11.9 180 each 0  . Olmesartan-amLODIPine-HCTZ 40-10-25 MG TABS daily.    . potassium chloride SA (K-DUR,KLOR-CON) 20 MEQ tablet     . sitaGLIPtin (JANUVIA) 100 MG tablet Take 1 tablet (100 mg total) by mouth daily. 30 tablet 11   No current facility-administered medications on file prior to visit.    Allergies  Allergen Reactions  . Invokana [Canagliflozin]   . Metformin And Related Diarrhea  . Trulicity [Dulaglutide]   . Bydureon [Exenatide] Rash  . Tanzeum [Albiglutide] Rash    Family History  Problem Relation Age of Onset  . Diabetes Mother     BP 140/60 (BP Location: Right Arm, Patient Position: Sitting, Cuff  Size: Large)   Pulse 83   Ht 5\' 8"  (1.727 m)   Wt 292 lb 6.4 oz (132.6 kg)   SpO2 96%   BMI 44.46 kg/m    Review of Systems Denies LOC.      Objective:   Physical Exam VITAL SIGNS:  See vs page GENERAL: no distress Pulses: dorsalis pedis intact bilat.   MSK: no deformity of the feet.   CV: 2+ bilat leg edema, and bilat VV's Skin:  no ulcer on the feet.  normal color and temp on the feet.  Neuro: sensation is intact to touch on the feet.   A1c=8.7%    Assessment & Plan:  Insulin-requiring type 2 DM, with CRI: uncontrolled.  Hypoglycemia, due to insulin: we have to adjust insulin for activity  Patient Instructions  I have sent a prescription to your pharmacy, to increase the insulin to 370 units each morning (but subtract 50 if you are going to be active), and: Please continue  the same Januvia.   check your blood sugar twice a day.  vary the time of day when you check, between before the 3 meals, and at bedtime.  also check if you have symptoms of your blood sugar being too high or too low.  please keep a record of the readings and bring it to your next appointment here (or you can bring the meter itself).  You can write it on any piece of paper.  please call us sooner if your blood sugar goes below 70, or if you have a lot of readings over 200.   Please come back for a follow-up appointment in 2 months.

## 2020-12-21 ENCOUNTER — Other Ambulatory Visit: Payer: Self-pay | Admitting: Endocrinology

## 2020-12-21 DIAGNOSIS — E113599 Type 2 diabetes mellitus with proliferative diabetic retinopathy without macular edema, unspecified eye: Secondary | ICD-10-CM

## 2020-12-24 ENCOUNTER — Ambulatory Visit: Payer: BC Managed Care – PPO | Admitting: Endocrinology

## 2021-02-06 ENCOUNTER — Telehealth: Payer: Self-pay | Admitting: Endocrinology

## 2021-02-06 NOTE — Telephone Encounter (Signed)
Pt called to report his wife just broke her leg and is going to need surgery so he will not be able to make the appt in person on Monday with Dr. He would like to know if he dropped his meter off today to get the readings printed and given to the Dr if he would be able to move his appt to a VV for Monday?

## 2021-02-09 ENCOUNTER — Other Ambulatory Visit: Payer: Self-pay

## 2021-02-09 ENCOUNTER — Telehealth (INDEPENDENT_AMBULATORY_CARE_PROVIDER_SITE_OTHER): Payer: BC Managed Care – PPO | Admitting: Endocrinology

## 2021-02-09 VITALS — BP 135/75 | HR 86 | Ht 68.0 in | Wt 300.0 lb

## 2021-02-09 DIAGNOSIS — Z794 Long term (current) use of insulin: Secondary | ICD-10-CM | POA: Diagnosis not present

## 2021-02-09 DIAGNOSIS — E113599 Type 2 diabetes mellitus with proliferative diabetic retinopathy without macular edema, unspecified eye: Secondary | ICD-10-CM | POA: Diagnosis not present

## 2021-02-09 MED ORDER — TOUJEO MAX SOLOSTAR 300 UNIT/ML ~~LOC~~ SOPN: 350.0000 [IU] | PEN_INJECTOR | SUBCUTANEOUS | 3 refills | Status: DC

## 2021-02-09 NOTE — Patient Instructions (Addendum)
Please come in soon, to have the A1c checked.   Please continue the same medications for now.  check your blood sugar twice a day.  vary the time of day when you check, between before the 3 meals, and at bedtime.  also check if you have symptoms of your blood sugar being too high or too low.  please keep a record of the readings and bring it to your next appointment here (or you can bring the meter itself).  You can write it on any piece of paper.  please call us sooner if your blood sugar goes below 70, or if you have a lot of readings over 200.   Please come back for a follow-up appointment in 2 months.

## 2021-02-09 NOTE — Progress Notes (Signed)
Subjective:    Patient ID: Brian Sweeney, male    DOB: 10-27-1955, 65 y.o.   MRN: [id number]  HPI telehealth visit today via video visit.  Alternatives to telehealth are presented to this patient, and the patient agrees to the telehealth visit. Pt is advised of the cost of the visit, and agrees to this, also.   Patient is at home, due to wife's injury, and I am at the office.   Persons attending the telehealth visit: the patient and I.  Pt returns for f/u of DM DM type: Insulin-requiring type 2 Dx'ed: 4081 Complications: PN, CRI, and PDR.  Therapy: insulin since 2018, and Januvia.   DKA: never Severe hypoglycemia: never.  Pancreatitis: never.  Pancreatic imaging: never.   Other: he did not tolerate metformin (diarrhea), Farxiga (diarrhea), or Ozempic (nausea); he declines multiple daily injections; he has NASH, but he can't take pioglitazone (edema).   Interval history: I have reviewed his cbg's.  cbg varies from 70-280. It is in general higher as the day goes on.  Pt says he never misses the insulin.   Past Medical History:  Diagnosis Date   Allergic rhinitis    Anxiety    Chronic right shoulder pain    Depression    Hypercholesterolemia    Hypertension    Migraine    Obesity, morbid ([facility])    Sleep apnea    Type II diabetes mellitus, uncontrolled ([place])     Past Surgical History:  Procedure Laterality Date   APPENDECTOMY     ROTATOR CUFF REPAIR Right    TUMOR REMOVAL      Social History   Socioeconomic History   Marital status: Married    Spouse name: Not on file   Number of children: Not on file   Years of education: Not on file   Highest education level: Not on file  Occupational History   Not on file  Tobacco Use   Smoking status: Former    Pack years: 0.00   Smokeless tobacco: Never  Substance and Sexual Activity   Alcohol use: Not Currently   Drug use: Not Currently   Sexual activity: Not on file  Other Topics Concern   Not on file  Social History  Narrative   Not on file   Social Determinants of Health   Financial Resource Strain: Not on file  Food Insecurity: Not on file  Transportation Needs: Not on file  Physical Activity: Not on file  Stress: Not on file  Social Connections: Not on file  Intimate Partner Violence: Not on file    Current Outpatient Medications on File Prior to Visit  Medication Sig Dispense Refill   BD PEN NEEDLE NANO U/F 32G X 4 MM MISC USE TO INJECT TWICE A DAY 100 each 11   cetirizine (ZYRTEC) 10 MG tablet Take 10 mg by mouth daily.     esomeprazole (NEXIUM) 20 MG capsule Take 20 mg by mouth daily at 12 noon.     furosemide (LASIX) 40 MG tablet      Olmesartan-amLODIPine-HCTZ 40-10-25 MG TABS daily.     ONETOUCH VERIO test strip USE TO CHECK BLOOD GLUCOSE TWICE 100 strip 2   ONETOUCH VERIO test strip USE ONE TEST STRIP TO CHECK BLOOD SUGAR TWICE DAILY 200 strip 0   potassium chloride SA (K-DUR,KLOR-CON) 20 MEQ tablet      sitaGLIPtin (JANUVIA) 100 MG tablet Take 1 tablet (100 mg total) by mouth daily. 30 tablet 11   No current  facility-administered medications on file prior to visit.    Allergies  Allergen Reactions   Invokana [Canagliflozin]    Metformin And Related Diarrhea   Trulicity [Dulaglutide]    Bydureon [Exenatide] Rash   Tanzeum [Albiglutide] Rash    Family History  Problem Relation Age of Onset   Diabetes Mother     BP 135/75 (BP Location: Left Arm, Patient Position: Sitting, Cuff Size: Normal)   Pulse 86   Ht 5\' 8"  (1.727 m)   Wt 300 lb (136.1 kg)   BMI 45.61 kg/m    Review of Systems     Objective:   Physical Exam      Assessment & Plan:  Insulin-requiring type 2 DM: uncontrolled.  If A1c is high, we'll change to a faster-acting qam insulin, or add HS snack.    Patient Instructions  Please come in soon, to have the A1c checked.   Please continue the same medications for now.  check your blood sugar twice a day.  vary the time of day when you check, between  before the 3 meals, and at bedtime.  also check if you have symptoms of your blood sugar being too high or too low.  please keep a record of the readings and bring it to your next appointment here (or you can bring the meter itself).  You can write it on any piece of paper.  please call us sooner if your blood sugar goes below 70, or if you have a lot of readings over 200.   Please come back for a follow-up appointment in 2 months.

## 2021-02-11 ENCOUNTER — Ambulatory Visit (INDEPENDENT_AMBULATORY_CARE_PROVIDER_SITE_OTHER): Payer: BC Managed Care – PPO

## 2021-02-11 ENCOUNTER — Other Ambulatory Visit: Payer: Self-pay

## 2021-02-11 DIAGNOSIS — E113599 Type 2 diabetes mellitus with proliferative diabetic retinopathy without macular edema, unspecified eye: Secondary | ICD-10-CM

## 2021-02-11 DIAGNOSIS — Z794 Long term (current) use of insulin: Secondary | ICD-10-CM

## 2021-02-11 LAB — POCT GLYCOSYLATED HEMOGLOBIN (HGB A1C): Hemoglobin A1C: 9.4 % — AB (ref 4.0–5.6)

## 2021-02-11 NOTE — Progress Notes (Signed)
A1C checked. Lab documented

## 2021-03-05 ENCOUNTER — Encounter: Payer: Self-pay | Admitting: Endocrinology

## 2021-03-18 ENCOUNTER — Encounter: Payer: Self-pay | Admitting: Orthopedic Surgery

## 2021-03-18 ENCOUNTER — Ambulatory Visit (INDEPENDENT_AMBULATORY_CARE_PROVIDER_SITE_OTHER): Payer: BC Managed Care – PPO | Admitting: Orthopedic Surgery

## 2021-03-18 ENCOUNTER — Other Ambulatory Visit: Payer: Self-pay

## 2021-03-18 ENCOUNTER — Ambulatory Visit: Payer: Self-pay

## 2021-03-18 DIAGNOSIS — M79601 Pain in right arm: Secondary | ICD-10-CM

## 2021-03-18 MED ORDER — METHOCARBAMOL 500 MG PO TABS: 500.0000 mg | ORAL_TABLET | Freq: Three times a day (TID) | ORAL | 0 refills | Status: AC | PRN

## 2021-03-24 ENCOUNTER — Telehealth: Payer: Self-pay | Admitting: Orthopedic Surgery

## 2021-03-24 NOTE — Telephone Encounter (Signed)
Tried calling. No answer. LMVM advising in detail ordered MRI arthrogram and this was how it needed to be entered for PA for insurance etc. Explained process of MR arthrogram.

## 2021-03-24 NOTE — Telephone Encounter (Signed)
Pt daughter call and her father gave her permission to ask these questions but she wants to know if the contrast of the MRI is IV? Also why is the DG FLUORO GUIDED  needed?   CB [phone]

## 2021-03-25 ENCOUNTER — Encounter: Payer: Self-pay | Admitting: Orthopedic Surgery

## 2021-03-25 NOTE — Progress Notes (Signed)
Office Visit Note   Patient: Brian Sweeney           Date of Birth: Aug 03, 1955           MRN: [id number] Visit Date: 03/18/2021 Requested by: Mayra Neer, MD [address] [place],  [place] [address] PCP: Mayra Neer, MD  Subjective: Chief Complaint  Patient presents with   Right Shoulder - Pain    HPI: Brian Sweeney is a 65 year old patient with right shoulder pain.  Had surgery in 2007 which was rotator cuff repair.  He was lifting his wife off the toilet and injured his shoulder.  He is unable to move his arm.  He is right-hand dominant.  Pain is worse with movement.              ROS: All systems reviewed are negative as they relate to the chief complaint within the history of present illness.  Patient denies  fevers or chills.   Assessment & Plan: Visit Diagnoses:  1. Right arm pain     Plan: Impression is right shoulder pain with likely injury at least to the posterior superior rotator cuff.  Has weakness with infraspinatus and supraspinatus testing.  Plan MRI arthrogram right shoulder to evaluate rotator cuff tear.  Robaxin also prescribed.  Follow-up after that study.  Follow-Up Instructions: Return for after MRI.   Orders:  Orders Placed This Encounter  Procedures   XR Shoulder Right   MR SHOULDER RIGHT W CONTRAST   Arthrogram   Meds ordered this encounter  Medications   methocarbamol (ROBAXIN) 500 MG tablet    Sig: Take 1 tablet (500 mg total) by mouth every 8 (eight) hours as needed for muscle spasms.    Dispense:  30 tablet    Refill:  0      Procedures: No procedures performed   Clinical Data: No additional findings.  Objective: Vital Signs: There were no vitals taken for this visit.  Physical Exam:   Constitutional: Patient appears well-developed HEENT:  Head: Normocephalic Eyes:EOM are normal Neck: Normal range of motion Cardiovascular: Normal rate Pulmonary/chest: Effort normal Neurologic: Patient is alert Skin: Skin is  warm Psychiatric: Patient has normal mood and affect   Ortho Exam: Ortho exam demonstrates limited forward flexion and abduction on the right shoulder.  Does have some crepitus with internal ex rotation of the right arm.  Motor sensory function is intact.  Deltoid is functional.  Passive range of motion is maintained up to shoulder level but he does have pain with that.  Negative apprehension testing at this time.  Specialty Comments:  No specialty comments available.  Imaging: No results found.   PMFS History: Patient Active Problem List   Diagnosis Date Noted   Diabetes ([place]) 02/01/2018   Multinodular goiter 02/01/2018   Past Medical History:  Diagnosis Date   Allergic rhinitis    Anxiety    Chronic right shoulder pain    Depression    Hypercholesterolemia    Hypertension    Migraine    Obesity, morbid ([place])    Sleep apnea    Type II diabetes mellitus, uncontrolled ([place])     Family History  Problem Relation Age of Onset   Diabetes Mother     Past Surgical History:  Procedure Laterality Date   APPENDECTOMY     ROTATOR CUFF REPAIR Right    TUMOR REMOVAL     Social History   Occupational History   Not on file  Tobacco Use  Smoking status: Former   Smokeless tobacco: Never  Substance and Sexual Activity   Alcohol use: Not Currently   Drug use: Not Currently   Sexual activity: Not on file

## 2021-04-09 ENCOUNTER — Ambulatory Visit
Admission: RE | Admit: 2021-04-09 | Discharge: 2021-04-09 | Disposition: A | Discharge status: Home / Self Care | Payer: BC Managed Care – PPO | Source: Ambulatory Visit | Attending: Orthopedic Surgery | Admitting: Orthopedic Surgery

## 2021-04-09 DIAGNOSIS — M79601 Pain in right arm: Secondary | ICD-10-CM

## 2021-04-09 MED ORDER — IOPAMIDOL (ISOVUE-M 200) INJECTION 41%
15.0000 mL | Freq: Once | INTRAMUSCULAR | Status: AC
  Administered 2021-04-09: 15 mL via INTRA_ARTICULAR

## 2021-04-27 ENCOUNTER — Ambulatory Visit (INDEPENDENT_AMBULATORY_CARE_PROVIDER_SITE_OTHER): Payer: BC Managed Care – PPO | Admitting: Orthopedic Surgery

## 2021-04-27 ENCOUNTER — Other Ambulatory Visit: Payer: Self-pay

## 2021-04-27 DIAGNOSIS — Z9889 Other specified postprocedural states: Secondary | ICD-10-CM | POA: Diagnosis not present

## 2021-05-05 ENCOUNTER — Encounter: Payer: Self-pay | Admitting: Orthopedic Surgery

## 2021-05-05 NOTE — Progress Notes (Addendum)
Office Visit Note   Patient: Brian Sweeney           Date of Birth: 08/11/1955           MRN: [id number] Visit Date: 04/27/2021 Requested by: Mayra Neer, MD [address] [place],  [place] [address] PCP: Mayra Neer, MD  Subjective: Chief Complaint  Patient presents with   Other     Scan review    HPI: Brian Sweeney is a 65 year old patient with right shoulder pain.  Since he was last seen he has had an MRI scan.  MRI scan shows prior rotator cuff repair.  There is a small focal full-thickness tear of the distal tendon anteriorly.  Subscapularis and teres minor are intact.  2 calcium hydroxyapatite crystals are present in the distal infraspinatus tendon.  There are some mild fatty infiltration of the supraspinatus and infraspinatus without atrophy.  He has been doing his own therapy of the Internet.  Patient states he hurt his shoulder while he was helping his wife off the toilet..  Overall he is doing much better than what he was at the time of his evaluation.              ROS: All systems reviewed are negative as they relate to the chief complaint within the history of present illness.  Patient denies  fevers or chills.   Assessment & Plan: Visit Diagnoses:  1. History of repair of right rotator cuff     Plan: Impression is recurrent right shoulder rotator cuff tear which is small involving the supraspinatus.  Does have a lot of tendinosis in the infraspinatus.  He hurt his shoulder at work.  Occurred 2 months ago.  Now he is able to AB duct the arm 90 degrees.  Plan is no lifting greater than 5 pounds with right arm for 6 weeks and 6-week return with decision at that time for or against intervention.  Follow-Up Instructions: No follow-ups on file.   Orders:  No orders of the defined types were placed in this encounter.  No orders of the defined types were placed in this encounter.     Procedures: No procedures performed   Clinical Data: No additional  findings.  Objective: Vital Signs: There were no vitals taken for this visit.  Physical Exam:   Constitutional: Patient appears well-developed HEENT:  Head: Normocephalic Eyes:EOM are normal Neck: Normal range of motion Cardiovascular: Normal rate Pulmonary/chest: Effort normal Neurologic: Patient is alert Skin: Skin is warm Psychiatric: Patient has normal mood and affect   Ortho Exam: Ortho exam demonstrates full active and passive range of motion of the cervical spine.  On the right-hand side the patient has pretty good infraspinatus supraspinatus and subscap strength with abduction to 90 degrees present.  Does have a little bit of coarseness and grinding with internal ex rotation on the right-hand side but no discrete AC joint tenderness.  Negative apprehension relocation testing.  No other masses lymphadenopathy or skin changes noted in that shoulder girdle region.  Specialty Comments:  No specialty comments available.  Imaging: No results found.   PMFS History: Patient Active Problem List   Diagnosis Date Noted   Diabetes ([place]) 02/01/2018   Multinodular goiter 02/01/2018   Past Medical History:  Diagnosis Date   Allergic rhinitis    Anxiety    Chronic right shoulder pain    Depression    Hypercholesterolemia    Hypertension    Migraine    Obesity, morbid ([place])  Sleep apnea    Type II diabetes mellitus, uncontrolled ([place])     Family History  Problem Relation Age of Onset   Diabetes Mother     Past Surgical History:  Procedure Laterality Date   APPENDECTOMY     ROTATOR CUFF REPAIR Right    TUMOR REMOVAL     Social History   Occupational History   Not on file  Tobacco Use   Smoking status: Former   Smokeless tobacco: Never  Substance and Sexual Activity   Alcohol use: Not Currently   Drug use: Not Currently   Sexual activity: Not on file

## 2021-05-06 ENCOUNTER — Encounter: Payer: Self-pay | Admitting: Orthopedic Surgery

## 2021-06-08 ENCOUNTER — Ambulatory Visit: Payer: BC Managed Care – PPO | Admitting: Surgical

## 2021-06-08 ENCOUNTER — Encounter: Payer: Self-pay | Admitting: Orthopedic Surgery

## 2021-06-08 ENCOUNTER — Other Ambulatory Visit: Payer: Self-pay

## 2021-06-08 DIAGNOSIS — Z9889 Other specified postprocedural states: Secondary | ICD-10-CM | POA: Diagnosis not present

## 2021-06-08 NOTE — Progress Notes (Signed)
Office Visit Note   Patient: Brian Sweeney           Date of Birth: 1955-10-22           MRN: [id number] Visit Date: 06/08/2021 Requested by: Mayra Neer, MD [address] [place],  [place] [address] PCP: Mayra Neer, MD  Subjective: Chief Complaint  Patient presents with   Right Shoulder - Follow-up    HPI: Brian Sweeney is a 65 y.o. male who presents to the office complaining of right shoulder pain.  Patient returns for 6-week reevaluation of right shoulder pain.  He has been doing home exercise program with the right shoulder daily focusing on range of motion exercises and strengthening with an orange exercise band.  He states that his shoulder is "a lot better" than it was 6 weeks ago.  He is only taking occasional Tylenol for pain control.  He is able to sleep in bed on his back with a pillow under his arm without waking due to shoulder pain.  He has been doing some lifting with the right arm locked by his side.              ROS: All systems reviewed are negative as they relate to the chief complaint within the history of present illness.  Patient denies fevers or chills.  Assessment & Plan: Visit Diagnoses:  1. History of repair of right rotator cuff     Plan: Patient is a 65 year old male who presents for repeat evaluation of right shoulder pain.  Excellent rotator cuff strength on exam today.  Significant improvement of pain even just last 6 weeks.  Plan for him to continue with home exercise program of the right shoulder 2-3 times per week and avoid overhead lifting and lifting out away from his body.  Surgery not really an option for him at this time and does not seem like he needs it based on his symptoms and excellent strength on exam.  Last A1c was around 11 and his blood glucose is usually around 300 in the morning despite the large amount of insulin that he is on.  Follow-up with the office as needed if symptoms worsen or fail to improve.  Follow-Up  Instructions: No follow-ups on file.   Orders:  No orders of the defined types were placed in this encounter.  No orders of the defined types were placed in this encounter.     Procedures: No procedures performed   Clinical Data: No additional findings.  Objective: Vital Signs: There were no vitals taken for this visit.  Physical Exam:  Constitutional: Patient appears well-developed HEENT:  Head: Normocephalic Eyes:EOM are normal Neck: Normal range of motion Cardiovascular: Normal rate Pulmonary/chest: Effort normal Neurologic: Patient is alert Skin: Skin is warm Psychiatric: Patient has normal mood and affect  Ortho Exam: Right shoulder with 30 degrees external rotation, 80 degrees abduction, 150 degrees forward flexion.  Moderate tenderness over the [facility] joint.  Moderate tenderness over the bicipital groove.  Well-healed incision in the anterior lateral right shoulder.  Axillary nerve intact with deltoid firing.  Active range of motion equivalent to passive range of motion.  Excellent rotator cuff strength of supraspinatus, infraspinatus, subscapularis rated 5/5.  Specialty Comments:  No specialty comments available.  Imaging: No results found.   PMFS History: Patient Active Problem List   Diagnosis Date Noted   Diabetes ([place]) 02/01/2018   Multinodular goiter 02/01/2018   Past Medical History:  Diagnosis Date  Allergic rhinitis    Anxiety    Chronic right shoulder pain    Depression    Hypercholesterolemia    Hypertension    Migraine    Obesity, morbid ([facility])    Sleep apnea    Type II diabetes mellitus, uncontrolled     Family History  Problem Relation Age of Onset   Diabetes Mother     Past Surgical History:  Procedure Laterality Date   APPENDECTOMY     ROTATOR CUFF REPAIR Right    TUMOR REMOVAL     Social History   Occupational History   Not on file  Tobacco Use   Smoking status: Former   Smokeless tobacco: Never  Substance and Sexual  Activity   Alcohol use: Not Currently   Drug use: Not Currently   Sexual activity: Not on file

## 2022-03-20 IMAGING — MR MR SHOULDER*R* W/CM
4 of 5 series · 13 of 40 positions shown · IV contrast (agent unspecified)
Comparison: Right shoulder x-rays dated March 18, 2021.

CLINICAL DATA: Right shoulder pain for the past month. History of
rotator cuff repair in 0303.

EXAM:
MR ARTHROGRAM OF THE RIGHT SHOULDER
TECHNIQUE: Multiplanar, multisequence MR imaging of the right shoulder was
performed following the administration of intra-articular contrast.
CONTRAST:  See Injection Documentation.

[Series 7: T1 fat-sat · axial · right · 3.0mm · 0.36mm/px · z∈[-53,-0]mm · 3 of 27 slices shown (1 of 2)]
[im 5/27]
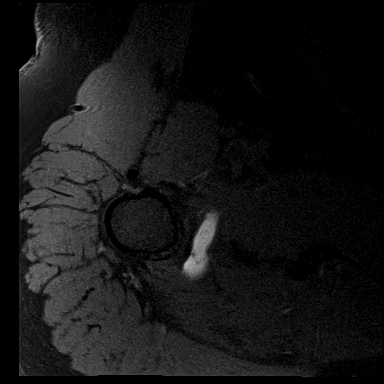
[im 14/27]
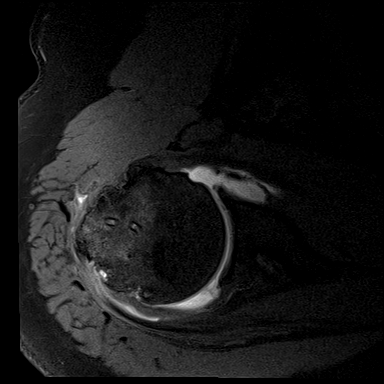
[im 22/27]
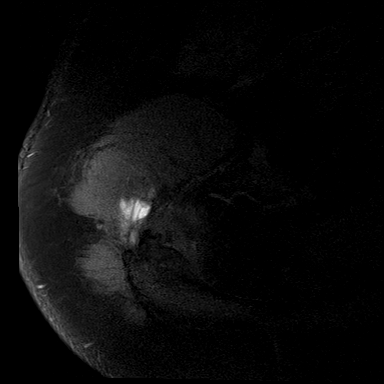

[Series 8: T2 fat-sat · oblique · right · 3.0mm · 0.22mm/px · 4 of 30 slices shown (1 of 2)]
[im 1/30]
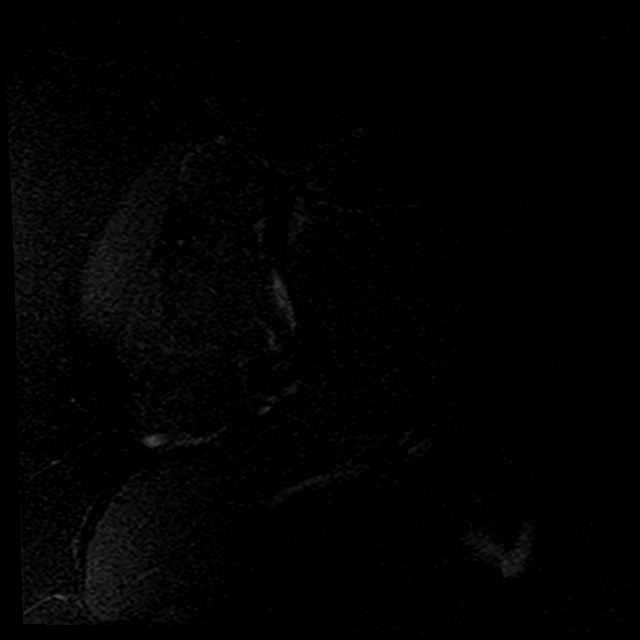
[im 4/30]
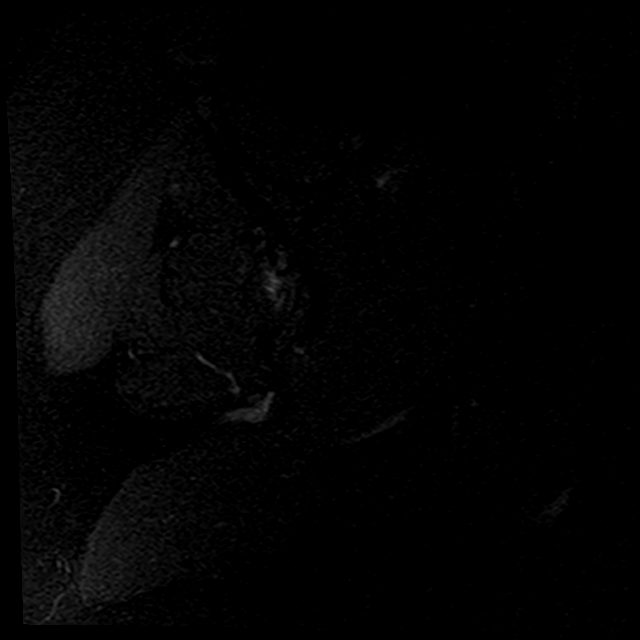
[im 15/30]
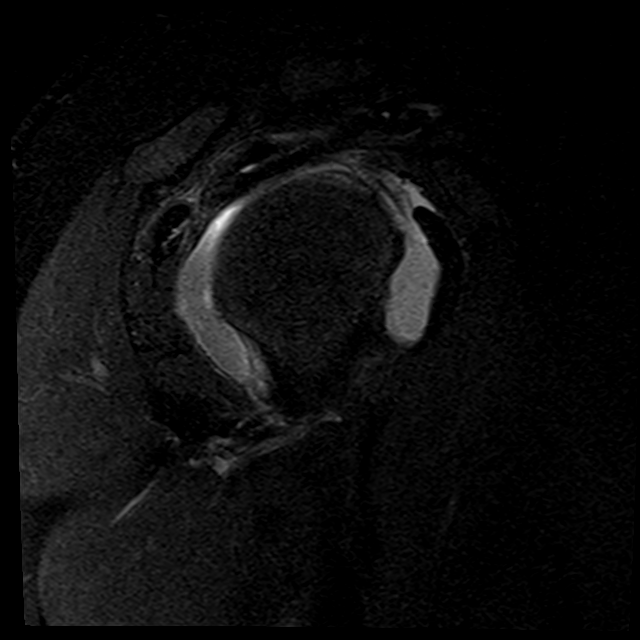
[im 26/30]
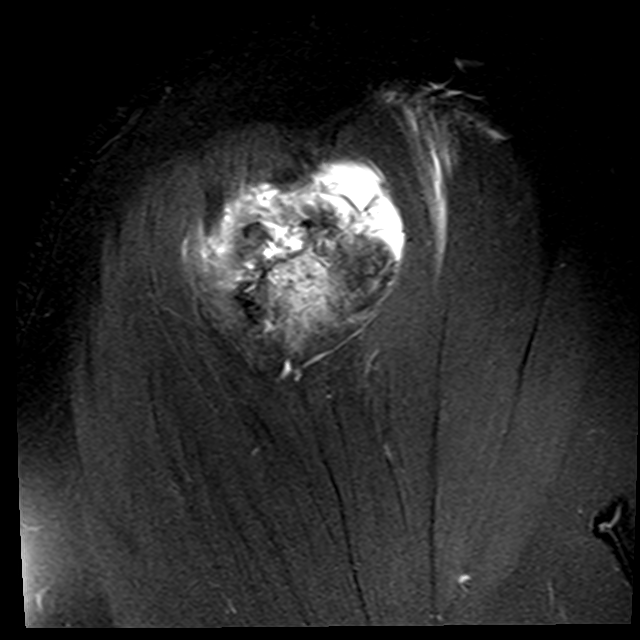

[Series 9: T1 fat-sat · oblique · right · 3.0mm · 0.18mm/px · 3 of 26 slices shown (2 of 2)]
[im 4/26]
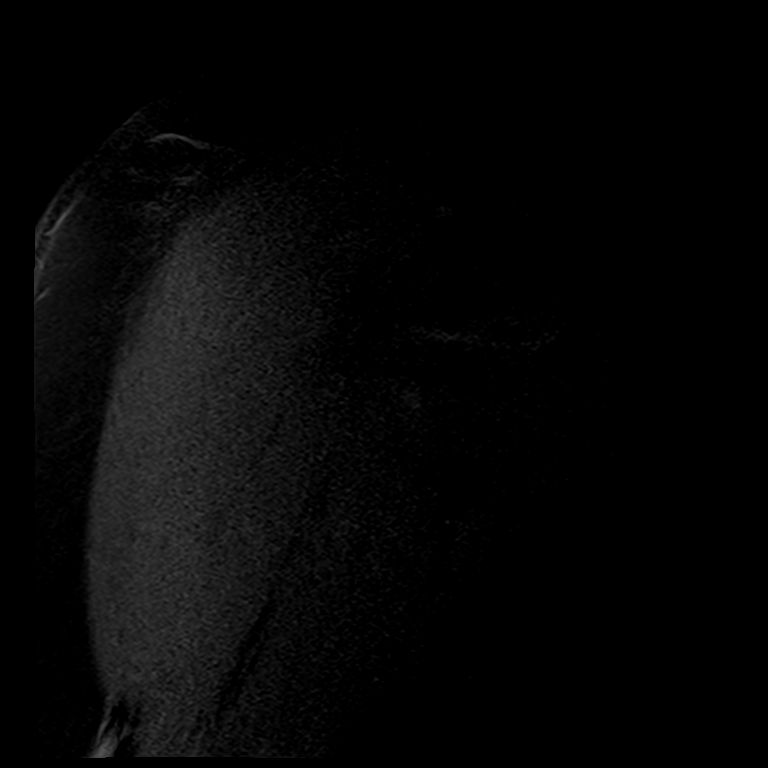
[im 15/26]
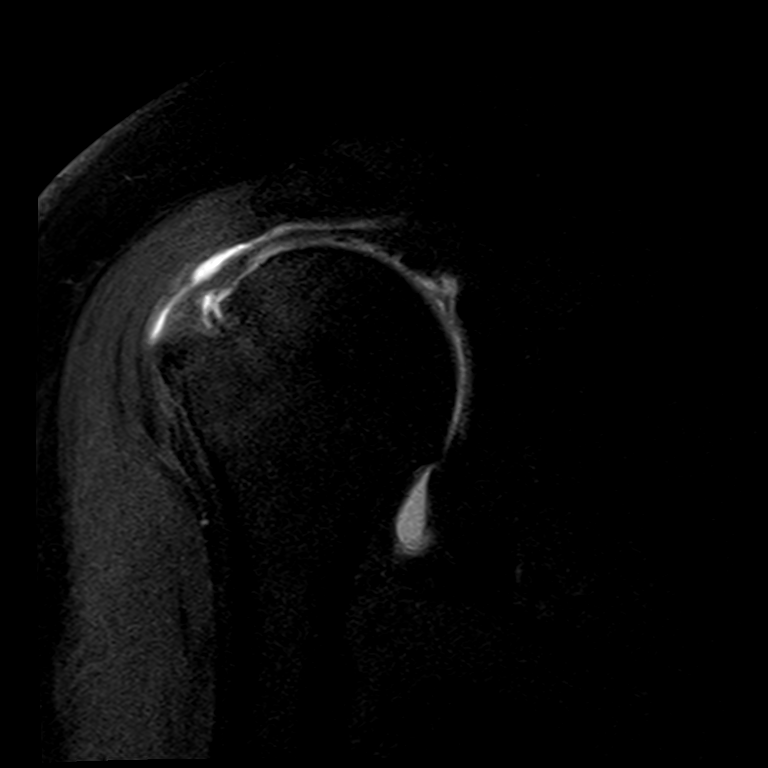
[im 22/26]
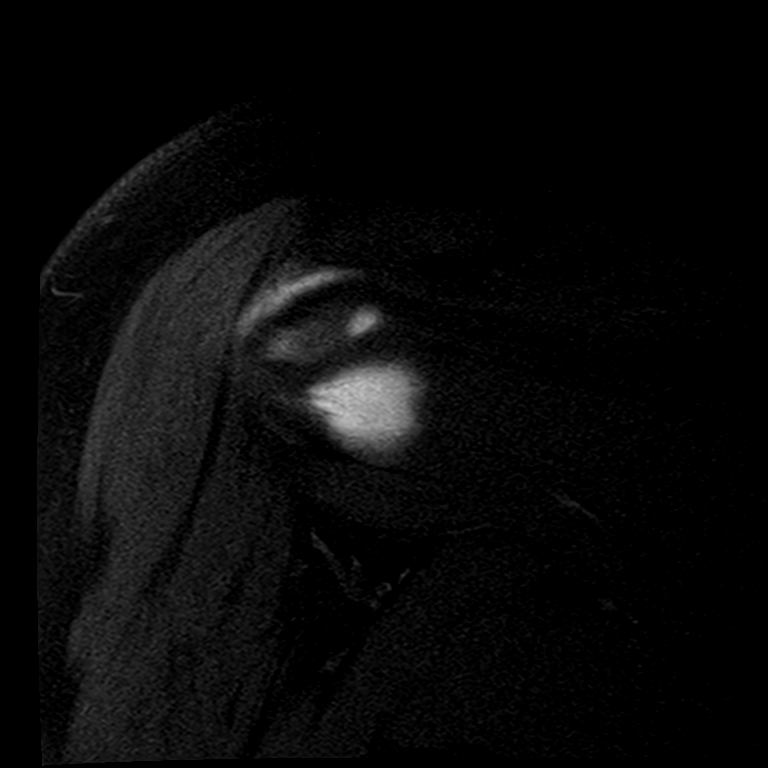

[Series 11: T2 fat-sat · oblique · right · 3.0mm · 0.22mm/px · 3 of 26 slices shown (2 of 2)]
[im 4/26]
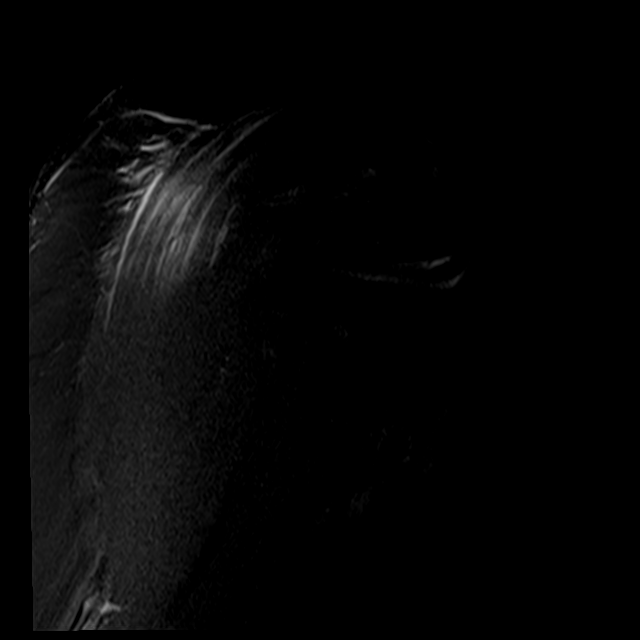
[im 15/26]
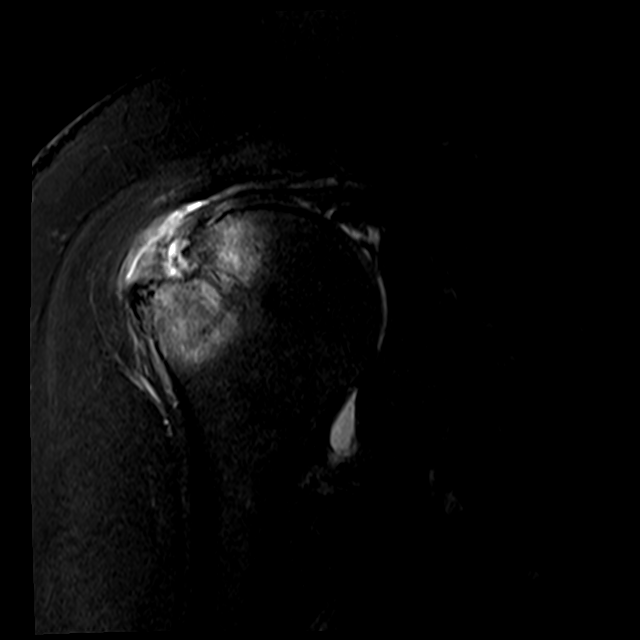
[im 22/26]
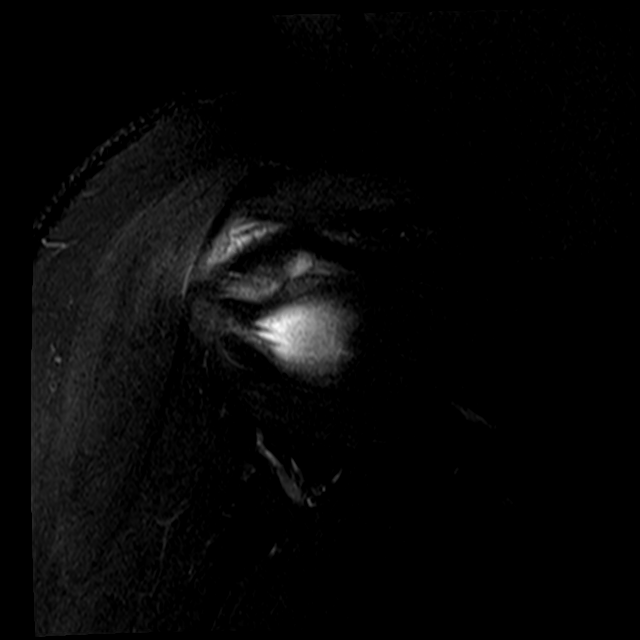

[13 of 40 positions shown; findings below may reference images not displayed]

FINDINGS: Rotator cuff: Prior rotator cuff repair. Supraspinatus tendinosis
with focal full-thickness tear of the distal tendon anteriorly
(series 9, image 16). Severe tendinosis of the distal infraspinatus
tendon with partial thickness articular surface tear and two foci of
calcium hydroxyapatite deposition. The subscapularis and teres minor
tendons are intact.

Muscles: Mild fatty infiltration of the supraspinatus,
infraspinatus, and teres minor muscles without frank atrophy. No
muscle edema.

Biceps long head:  Prior tenodesis.

Acromioclavicular Joint: The acromion is type II. There are mild
acromioclavicular degenerative changes. Moderate amount of contrast
in the subacromial/subdeltoid bursa.

Glenohumeral Joint: Distended with intra-articular contrast.
Scattered cartilage thinning without chondral defect.

Labrum: No evidence of labral tear. Blunted superior labrum, likely
postsurgical.

Bones: Prominent reactive marrow edema in the greater tuberosity. No
acute fracture or dislocation. No suspicious bone lesion.

Other: None.
IMPRESSION: 1. Prior rotator cuff repair with focal full-thickness tear of the
distal supraspinatus tendon anteriorly. No retraction.
2. Severe infraspinatus tendinosis with partial thickness articular
surface tear and calcific tendinitis.
3. Mild acromioclavicular and glenohumeral osteoarthritis.

## 2022-07-14 ENCOUNTER — Encounter: Payer: Self-pay | Admitting: Internal Medicine

## 2022-07-14 ENCOUNTER — Ambulatory Visit: Payer: BC Managed Care – PPO | Admitting: Internal Medicine

## 2022-07-14 VITALS — BP 142/86 | HR 83 | Ht 68.0 in | Wt 279.0 lb

## 2022-07-14 DIAGNOSIS — E1165 Type 2 diabetes mellitus with hyperglycemia: Secondary | ICD-10-CM | POA: Diagnosis not present

## 2022-07-14 LAB — POCT GLYCOSYLATED HEMOGLOBIN (HGB A1C): Hemoglobin A1C: 7.3 % — AB (ref 4.0–5.6)

## 2022-07-14 MED ORDER — MOUNJARO 5 MG/0.5ML ~~LOC~~ SOAJ: 5.0000 mg | SUBCUTANEOUS | 3 refills | Status: DC

## 2022-07-14 MED ORDER — GLIMEPIRIDE 2 MG PO TABS: 2.0000 mg | ORAL_TABLET | Freq: Every day | ORAL | 6 refills | Status: DC

## 2022-07-14 MED ORDER — EMPAGLIFLOZIN 10 MG PO TABS: 10.0000 mg | ORAL_TABLET | Freq: Every day | ORAL | 6 refills | Status: DC

## 2022-07-14 NOTE — Progress Notes (Signed)
Name: Brian Sweeney  Age/ Sex: 66 y.o., male   MRN/ DOB: [id number], 1955-10-01     PCP: Mayra Neer, MD   Reason for Endocrinology Evaluation: Type 2 Diabetes Mellitus  Initial Endocrine Consultative Visit: 12/30/2017    PATIENT IDENTIFIER: Brian Sweeney is a 66 y.o. male with a past medical history of DM, OSA, Obesity, HTN and dyslipidemia . The patient has followed with Endocrinology clinic since 12/30/2017 for consultative assistance with management of his diabetes.  DIABETIC HISTORY:  Brian Sweeney was diagnosed with DM 2015, and started insulin therapy in 2018.  He w [name] mouth as on Januvia at some point.  He is intolerant to metformin-diarrhea, Farxiga-diarrhea, Ozempic-nausea, tanzeum-pruritus and rash at the injection site, by durian-injection site redness, Rybelsus-nausea/vomiting , pioglitazone-edema. His hemoglobin A1c has ranged from 7.4% in 2019, peaking at 10.7% in 2021.  He was followed by Dr. Arnoldo Lenis from 2019 until July 2022    He was started on [facility] through his PCPs office in December 2022  SUBJECTIVE:   During the last visit (02/09/2021): saw Dr. Loanne Drilling      Today (07/14/2022): Brian Sweeney is here for diabetes management.  He checks his blood sugars 1-2 times daily. The patient has not had hypoglycemic episodes since the last clinic visit.  He was started on [facility] through his PCPs office in December 2022 but is having insurance coverage issues with a high co-pay. His PCP's office tried multiple time to get a PA, coupons, contacted rep without much change in high co-pay   He was forced in to retirement 12/2021  and his state health plan was changed from 80/20 to 70/30 which explains his increase in co-pay price  He is getting on Medicare in 09/2022   He has not used Toujeo in over 6 months.  He has been out of [facility] for a month    He had constipation with [name]  Eats 2 meals a day, snacks  more than in the past since retirement.      HOME DIABETES REGIMEN:  Toujeo 350 units daily - not taking for past 6 months  Mounjaro 5 mg weekly - not taking 1 month     Statin: yes ACE-I/ARB: yes     METER DOWNLOAD SUMMARY: Date range evaluated: 11/30-12/13/2023 Average Number Tests/Day = 1 Overall Mean FS Glucose = 177 Standard Deviation = 26  BG Ranges: Low = 147 High = 234   Hypoglycemic Events/30 Days: BG < 50 = 0 Episodes of symptomatic severe hypoglycemia = 0    DIABETIC COMPLICATIONS: Microvascular complications:   Denies: CKD , neuropathy , retinopathy  Last Eye Exam: Completed 2022  Macrovascular complications:   Denies: CAD, CVA, PVD   HISTORY:  Past Medical History:  Past Medical History:  Diagnosis Date   Allergic rhinitis    Anxiety    Chronic right shoulder pain    Depression    Hypercholesterolemia    Hypertension    Migraine    Obesity, morbid ([place])    Sleep apnea    Type II diabetes mellitus, uncontrolled    Past Surgical History:  Past Surgical History:  Procedure Laterality Date   APPENDECTOMY     ROTATOR CUFF REPAIR Right    TUMOR REMOVAL     Social History:  reports that he has quit smoking. He has never used smokeless tobacco. He reports that he does not currently use alcohol. He reports that he does not currently use drugs. Family History:  Family History  Problem Relation Age of Onset   Diabetes Mother      HOME MEDICATIONS: Allergies as of 07/14/2022       Reactions   Invokana [canagliflozin]    Metformin And Related Diarrhea   Trulicity [dulaglutide]    Bydureon [exenatide] Rash   Tanzeum [albiglutide] Rash        Medication List        Accurate as of July 14, 2022  1:24 PM. If you have any questions, ask your nurse or doctor.          STOP taking these medications    sitaGLIPtin 100 MG tablet Commonly known as: Januvia Stopped by: [name], MD       TAKE these medications    BD Pen Needle Nano U/F 32G X 4 MM  Misc Generic drug: Insulin Pen Needle USE TO INJECT TWICE A DAY   cetirizine 10 MG tablet Commonly known as: ZYRTEC Take 10 mg by mouth daily.   esomeprazole 20 MG capsule Commonly known as: NEXIUM Take 20 mg by mouth daily at 12 noon.   furosemide 40 MG tablet Commonly known as: LASIX   methocarbamol 500 MG tablet Commonly known as: ROBAXIN Take 1 tablet (500 mg total) by mouth every 8 (eight) hours as needed for muscle spasms.   Mounjaro 5 MG/0.5ML Pen Generic drug: tirzepatide Inject 5 mg into the skin once a week.   Olmesartan-amLODIPine-HCTZ 40-10-25 MG Tabs daily.   OneTouch Verio test strip Generic drug: glucose blood USE TO CHECK BLOOD GLUCOSE TWICE   OneTouch Verio test strip Generic drug: glucose blood USE ONE TEST STRIP TO CHECK BLOOD SUGAR TWICE DAILY   potassium chloride SA 20 MEQ tablet Commonly known as: KLOR-CON M   rosuvastatin 10 MG tablet Commonly known as: CRESTOR Take 10 mg by mouth daily.   Toujeo Max SoloStar 300 UNIT/ML Solostar Pen Generic drug: insulin glargine (2 Unit Dial) Inject 350 Units into the skin every morning.         OBJECTIVE:   Vital Signs: BP (!) 142/86 (BP Location: Left Arm, Patient Position: Sitting, Cuff Size: Large)   Pulse 83   Ht '5\' 8"'$  (1.727 m)   Wt 279 lb (126.6 kg)   SpO2 97%   BMI 42.42 kg/m   Wt Readings from Last 3 Encounters:  07/14/22 279 lb (126.6 kg)  02/09/21 300 lb (136.1 kg)  10/23/20 292 lb 6.4 oz (132.6 kg)     Exam: General: Pt appears well and is in NAD  Neck: General: Supple without adenopathy. Thyroid: Thyroid size normal.  No goiter or nodules appreciated.   Lungs: Clear with good BS bilat   Heart: RRR   Abdomen:  soft, nontender  Extremities: Trace  pretibial edema.   Neuro: MS is good with appropriate affect, pt is alert and Ox3          DATA REVIEWED:  Lab Results  Component Value Date   HGBA1C 7.3 (A) 07/14/2022   HGBA1C 9.4 (A) 02/11/2021   HGBA1C 8.7 (A)  10/23/2020   Lab Results  Component Value Date   CREATININE 1.2 05/23/2019   Lab Results  Component Value Date   MICRALBCREAT 25.0 05/23/2019    Old records , labs and images have been reviewed.   ASSESSMENT / PLAN / RECOMMENDATIONS:   1) Type 2 Diabetes Mellitus,  sub-optimally controlled, Without complications - Most recent A1c of 7.3 %. Goal A1c < 7.0 %.     - A1c is acceptable  -  He did well with mounjaro but unfortunately due to change in state health plan his co-pay increased to > $300 . - We disucssed starting Glimepiride and Jardiance  - He has intolerance issues to several agents insulinding farxiga, invokana, trulicity, ozempic and Rybelsus   - He was provided with # 8 pens of mounjaro 2.5 mg weekly to use for now  - Cautioned against hypoglycemia with glimepiride  - Pt brought his records from PCP which were reviewed today     MEDICATIONS: Start Glimepiride 2 mg, 1 tablet before Breakfast  Start Jardiance 10 mg , 1 tablet every morning  Continue Mounjaro 5 mg weekly   EDUCATION / INSTRUCTIONS: BG monitoring instructions: Patient is instructed to check his blood sugars 1 times a day, fasting . Call [place] [facility] if: BG persistently < 70  I reviewed the Rule of 15 for the treatment of hypoglycemia in detail with the patient. Literature supplied.    2) Diabetic complications:  Eye: Does not have known diabetic retinopathy.  Neuro/ Feet: Does not have known diabetic peripheral neuropathy .  Renal: Patient does not have known baseline CKD. He   is  on an ACEI/ARB at present.       F/U in 4 months     Signed electronically by: Mack Guise, MD  [facility] Endocrinology  [facility] [address] [place],  [address] Phone: [phone] FAX: [phone]   CC: Mayra Neer, MD [place] [address] [employer] [place] [address] Phone: [phone]  Fax: [phone]  Return to Endocrinology  clinic as below: No future appointments.

## 2022-07-14 NOTE — Patient Instructions (Signed)
Start Glimepiride 2 mg, 1 tablet before Breakfast  Start Jardiance 10 mg , 1 tablet every morning  Continue Mounjaro 5 mg weekly      HOW TO TREAT LOW BLOOD SUGARS (Blood sugar LESS THAN 70 MG/DL) Please follow the RULE OF 15 for the treatment of hypoglycemia treatment (when your (blood sugars are less than 70 mg/dL)   STEP 1: Take 15 grams of carbohydrates when your blood sugar is low, which includes:  3-4 GLUCOSE TABS  OR 3-4 OZ OF JUICE OR REGULAR SODA OR ONE TUBE OF GLUCOSE GEL    STEP 2: RECHECK blood sugar in 15 MINUTES STEP 3: If your blood sugar is still low at the 15 minute recheck --> then, go back to STEP 1 and treat AGAIN with another 15 grams of carbohydrates.

## 2022-12-08 ENCOUNTER — Ambulatory Visit: Payer: Medicare PPO | Admitting: Internal Medicine

## 2022-12-08 ENCOUNTER — Encounter: Payer: Self-pay | Admitting: Internal Medicine

## 2022-12-08 VITALS — BP 120/76 | HR 74 | Ht 68.0 in | Wt 283.0 lb

## 2022-12-08 DIAGNOSIS — E1165 Type 2 diabetes mellitus with hyperglycemia: Secondary | ICD-10-CM

## 2022-12-08 DIAGNOSIS — Z7984 Long term (current) use of oral hypoglycemic drugs: Secondary | ICD-10-CM | POA: Diagnosis not present

## 2022-12-08 DIAGNOSIS — E1142 Type 2 diabetes mellitus with diabetic polyneuropathy: Secondary | ICD-10-CM

## 2022-12-08 LAB — POCT GLYCOSYLATED HEMOGLOBIN (HGB A1C): Hemoglobin A1C: 7.9 % — AB (ref 4.0–5.6)

## 2022-12-08 MED ORDER — GLIMEPIRIDE 2 MG PO TABS: 2.0000 mg | ORAL_TABLET | Freq: Every day | ORAL | 3 refills | Status: DC

## 2022-12-08 MED ORDER — EMPAGLIFLOZIN 25 MG PO TABS: 25.0000 mg | ORAL_TABLET | Freq: Every day | ORAL | 3 refills | Status: DC

## 2022-12-08 NOTE — Progress Notes (Addendum)
Name: Brian Sweeney  Age/ Sex: 67 y.o., male   MRN/ DOB: [id number], Mar 04, 1956     PCP: Lupita Raider, MD   Reason for Endocrinology Evaluation: Type 2 Diabetes Mellitus  Initial Endocrine Consultative Visit: 12/30/2017    PATIENT IDENTIFIER: Brian Sweeney is a 67 y.o. male with a past medical history of DM, OSA, Obesity, HTN and dyslipidemia . The patient has followed with Endocrinology clinic since 12/30/2017 for consultative assistance with management of his diabetes.  DIABETIC HISTORY:  Mr. Ola was diagnosed with DM 2015, and started insulin therapy in 2018.  He w Chyrel Masson mouth as on Januvia at some point.  He is intolerant to metformin-diarrhea, Farxiga-diarrhea, Ozempic-nausea, tanzeum-pruritus and rash at the injection site, by durian-injection site redness, Rybelsus-nausea/vomiting , pioglitazone-edema. His hemoglobin A1c has ranged from 7.4% in 2019, peaking at 10.7% in 2021.  He was followed by Dr. Lanna Poche from 2019 until July 2022    He was started on [facility] through his PCPs office in December 2022  On his initial visit with me 07/2022, his A1c was 7.3%, he had Toujeo on his list that he had not taken for 6 months prior to his presentation as well as Greggory Keen that has not taken a month prior to his presentation, due to changes in insurance his co-pays were high.   I started him on glimepiride, Jardiance, and provided him with Mounjaro samples until he started new insurance   SUBJECTIVE:   During the last visit (07/14/2022): A1c 7.3%     Today (12/08/2022): [name] is here for diabetes management.  He checks his blood sugars 1-2 times daily. The patient has not had hypoglycemic episodes since the last clinic visit.   Denies nausea, or vomiting  Constipation or diarrhea except on [facility]     HOME DIABETES REGIMEN:  Glimepiride 2 mg, 1 tablet before Breakfast  Jardiance 10 mg , 1 tablet every morning  Continue Mounjaro 5 mg weekly - not taking     Statin: yes ACE-I/ARB: yes     METER DOWNLOAD SUMMARY: Date range evaluated: 4/9-12/08/2022 Average Number Tests/Day = 1 Overall Mean FS Glucose = 158 Standard Deviation = 22  BG Ranges: Low = 100 High = 197   Hypoglycemic Events/30 Days: BG < 50 = 0 Episodes of symptomatic severe hypoglycemia = 0    DIABETIC COMPLICATIONS: Microvascular complications:  Neuropathy Denies: CKD , retinopathy  Last Eye Exam: Completed 2022  Macrovascular complications:   Denies: CAD, CVA, PVD   HISTORY:  Past Medical History:  Past Medical History:  Diagnosis Date   Allergic rhinitis    Anxiety    Chronic right shoulder pain    Depression    Hypercholesterolemia    Hypertension    Migraine    Obesity, morbid ([facility])    Sleep apnea    Type II diabetes mellitus, uncontrolled    Past Surgical History:  Past Surgical History:  Procedure Laterality Date   APPENDECTOMY     ROTATOR CUFF REPAIR Right    TUMOR REMOVAL     Social History:  reports that he has quit smoking. He has never used smokeless tobacco. He reports that he does not currently use alcohol. He reports that he does not currently use drugs. Family History:  Family History  Problem Relation Age of Onset   Diabetes Mother      HOME MEDICATIONS: Allergies as of 12/08/2022       Reactions   Invokana [canagliflozin]    Metformin And  Related Diarrhea   Trulicity [dulaglutide]    Bydureon [exenatide] Rash   Tanzeum [albiglutide] Rash        Medication List        Accurate as of Dec 08, 2022  8:40 AM. If you have any questions, ask your nurse or doctor.          BD Pen Needle Nano U/F 32G X 4 MM Misc Generic drug: Insulin Pen Needle USE TO INJECT TWICE A DAY   cetirizine 10 MG tablet Commonly known as: ZYRTEC Take 10 mg by mouth daily.   empagliflozin 10 MG Tabs tablet Commonly known as: Jardiance Take 1 tablet (10 mg total) by mouth daily before breakfast.   esomeprazole 20 MG  capsule Commonly known as: NEXIUM Take 20 mg by mouth daily at 12 noon.   furosemide 40 MG tablet Commonly known as: LASIX   glimepiride 2 MG tablet Commonly known as: AMARYL Take 1 tablet (2 mg total) by mouth daily before breakfast.   methocarbamol 500 MG tablet Commonly known as: ROBAXIN Take 1 tablet (500 mg total) by mouth every 8 (eight) hours as needed for muscle spasms.   Mounjaro 5 MG/0.5ML Pen Generic drug: tirzepatide Inject 5 mg into the skin once a week.   Olmesartan-amLODIPine-HCTZ 40-10-25 MG Tabs daily.   OneTouch Verio test strip Generic drug: glucose blood USE TO CHECK BLOOD GLUCOSE TWICE What changed: Another medication with the same name was removed. Continue taking this medication, and follow the directions you see here. Changed by: Scarlette Shorts, MD   potassium chloride SA 20 MEQ tablet Commonly known as: KLOR-CON M   rosuvastatin 10 MG tablet Commonly known as: CRESTOR Take 10 mg by mouth daily.         OBJECTIVE:   Vital Signs: BP 120/76 (BP Location: Left Arm, Patient Position: Sitting, Cuff Size: Large)   Pulse 74   Ht 5\' 8"  (1.727 m)   Wt 283 lb (128.4 kg)   SpO2 99%   BMI 43.03 kg/m   Wt Readings from Last 3 Encounters:  12/08/22 283 lb (128.4 kg)  07/14/22 279 lb (126.6 kg)  02/09/21 300 lb (136.1 kg)     Exam: General: Pt appears well and is in NAD  Lungs: Clear with good BS bilat   Heart: RRR   Extremities: Trace  pretibial edema.   Neuro: MS is good with appropriate affect, pt is alert and Ox3    DM Foot Exam 12/08/2022  The skin of the feet is intact without sores or ulcerations. The pedal pulses are 1+ on right and 1+ on left. The sensation is decreased to a screening 5.07, 10 gram monofilament on the left     DATA REVIEWED:  Lab Results  Component Value Date   HGBA1C 7.9 (A) 12/08/2022   HGBA1C 7.3 (A) 07/14/2022   HGBA1C 9.4 (A) 02/11/2021   07/23/2022 BUN 19 CR 1.07 GFR 77 HDL 38 LDL  74 MA/CR 0.7 TG 119 TSH 2.28   Old records , labs and images have been reviewed.   ASSESSMENT / PLAN / RECOMMENDATIONS:   1) Type 2 Diabetes Mellitus,  sub-optimally controlled, With Neuropathic complications - Most recent A1c of 7.9 %. Goal A1c < 7.0 %.     - A1c is trending up [employer] did not cover Mounjaro -I have increased Jardiance as below, he was also provided with patient assistance forms -We will hold off on restarting insulin at this time -Patient encouraged to continue to take  glimepiride before breakfast - He has intolerance issues to several agents insulinding farxiga, invokana, trulicity, ozempic and Rybelsus     MEDICATIONS: Continue glimepiride 2 mg, 1 tablet before Breakfast  Increase Jardiance 25 mg , 1 tablet every morning    EDUCATION / INSTRUCTIONS: BG monitoring instructions: Patient is instructed to check his blood sugars 1 times a day, fasting . Call [place] [facility] if: BG persistently < 70  I reviewed the Rule of 15 for the treatment of hypoglycemia in detail with the patient. Literature supplied.    2) Diabetic complications:  Eye: Does not have known diabetic retinopathy.  Neuro/ Feet: Does have known diabetic peripheral neuropathy .  Renal: Patient does not have known baseline CKD. He   is  on an ACEI/ARB at present.   3) Dyslipidemia:  -Patient on statin therapy through his PCP, LDL at goal    F/U in 6 months     Signed electronically by: Lyndle Herrlich, MD  [facility] Endocrinology  [facility] [address] [place] [place], [place] [address] Phone: [phone] FAX: [phone]   CC: Lupita Raider, MD [address] [place] [address] Phone: [phone]  Fax: [phone]  Return to Endocrinology clinic as below: No future appointments.

## 2022-12-08 NOTE — Patient Instructions (Signed)
Continue  Glimepiride 2 mg, 1 tablet before Breakfast  Increase Jardiance 25 mg , 1 tablet every morning       HOW TO TREAT LOW BLOOD SUGARS (Blood sugar LESS THAN 70 MG/DL) Please follow the RULE OF 15 for the treatment of hypoglycemia treatment (when your (blood sugars are less than 70 mg/dL)   STEP 1: Take 15 grams of carbohydrates when your blood sugar is low, which includes:  3-4 GLUCOSE TABS  OR 3-4 OZ OF JUICE OR REGULAR SODA OR ONE TUBE OF GLUCOSE GEL    STEP 2: RECHECK blood sugar in 15 MINUTES STEP 3: If your blood sugar is still low at the 15 minute recheck --> then, go back to STEP 1 and treat AGAIN with another 15 grams of carbohydrates.

## 2023-01-24 LAB — LAB REPORT - SCANNED: EGFR: 65

## 2023-04-25 ENCOUNTER — Ambulatory Visit: Payer: Medicare PPO | Admitting: Internal Medicine

## 2023-04-25 ENCOUNTER — Encounter: Payer: Self-pay | Admitting: Internal Medicine

## 2023-04-25 VITALS — BP 130/73 | HR 78 | Ht 68.0 in | Wt 284.8 lb

## 2023-04-25 DIAGNOSIS — E1165 Type 2 diabetes mellitus with hyperglycemia: Secondary | ICD-10-CM | POA: Diagnosis not present

## 2023-04-25 DIAGNOSIS — E1142 Type 2 diabetes mellitus with diabetic polyneuropathy: Secondary | ICD-10-CM

## 2023-04-25 DIAGNOSIS — Z7984 Long term (current) use of oral hypoglycemic drugs: Secondary | ICD-10-CM | POA: Diagnosis not present

## 2023-04-25 LAB — POCT GLYCOSYLATED HEMOGLOBIN (HGB A1C): Hemoglobin A1C: 8.3 % — AB (ref 4.0–5.6)

## 2023-04-25 MED ORDER — TIRZEPATIDE 2.5 MG/0.5ML ~~LOC~~ SOAJ: 2.5000 mg | SUBCUTANEOUS | 6 refills | Status: DC

## 2023-04-25 MED ORDER — GLIPIZIDE 5 MG PO TABS: 5.0000 mg | ORAL_TABLET | Freq: Two times a day (BID) | ORAL | 3 refills | Status: DC

## 2023-04-25 MED ORDER — EMPAGLIFLOZIN 25 MG PO TABS: 25.0000 mg | ORAL_TABLET | Freq: Every day | ORAL | 3 refills | Status: DC

## 2023-04-25 NOTE — Patient Instructions (Addendum)
Start Glipizide 5 mg, 1 tablet before breakfast and 1 tablet before supper Stop Glimepiride 2 mg, 1 tablet before Breakfast  Continue Jardiance 25 mg , 1 tablet every morning  A prescription for Mounjaro 2.5 mg once weekly has been sent to the pharmacy, to see if this is covered      HOW TO TREAT LOW BLOOD SUGARS (Blood sugar LESS THAN 70 MG/DL) Please follow the RULE OF 15 for the treatment of hypoglycemia treatment (when your (blood sugars are less than 70 mg/dL)   STEP 1: Take 15 grams of carbohydrates when your blood sugar is low, which includes:  3-4 GLUCOSE TABS  OR 3-4 OZ OF JUICE OR REGULAR SODA OR ONE TUBE OF GLUCOSE GEL    STEP 2: RECHECK blood sugar in 15 MINUTES STEP 3: If your blood sugar is still low at the 15 minute recheck --> then, go back to STEP 1 and treat AGAIN with another 15 grams of carbohydrates.

## 2023-04-25 NOTE — Progress Notes (Unsigned)
Name: Brian Sweeney  Age/ Sex: 67 y.o., male   MRN/ DOB: [id number], [birth year]     PCP: Lupita Raider, MD   Reason for Endocrinology Evaluation: Type 2 Diabetes Mellitus  Initial Endocrine Consultative Visit: 12/30/2017    PATIENT IDENTIFIER: [name] is a 68 y.o. male with a past medical history of DM, OSA, Obesity, HTN and dyslipidemia . The patient has followed with Endocrinology clinic since 12/30/2017 for consultative assistance with management of his diabetes.  DIABETIC HISTORY:  Brian Sweeney was diagnosed with DM 2015, and started insulin therapy in 2018.  He w Chyrel Sweeney mouth as on Januvia at some point.  He is intolerant to metformin-diarrhea, Farxiga-diarrhea, Ozempic-nausea, tanzeum-pruritus and rash at the injection site, by durian-injection site redness, Rybelsus-nausea/vomiting , pioglitazone-edema. His hemoglobin A1c has ranged from 7.4% in 2019, peaking at 10.7% in 2021.  He was followed by Dr. Lanna Poche from 2019 until July 2022    He was started on [facility] through his PCPs office in December 2022  On his initial visit with me 07/2022, his A1c was 7.3%, he had Toujeo on his list that he had not taken for 6 months prior to his presentation as well as Greggory Keen that has not taken a month prior to his presentation, due to changes in insurance his co-pays were high.   I started him on glimepiride, Jardiance, and provided him with Mounjaro samples until he started new insurance   SUBJECTIVE:   During the last visit (12/08/2022): A1c 7.9%     Today (04/25/2023): Brian Sweeney is here for diabetes management.  He checks his blood sugars 1-2 times daily. The patient has not had hypoglycemic episodes since the last clinic visit.   Denies nausea, or vomiting  Denies constipation or diarrhea   Has dry mouth    HOME DIABETES REGIMEN:  Glimepiride 2 mg, 1 tablet before Breakfast  Jardiance 25 mg , 1 tablet every morning     Statin: yes ACE-I/ARB: yes      METER DOWNLOAD SUMMARY:  123-224    DIABETIC COMPLICATIONS: Microvascular complications:  Neuropathy Denies: CKD , retinopathy  Last Eye Exam: Completed 2022  Macrovascular complications:   Denies: CAD, CVA, PVD   HISTORY:  Past Medical History:  Past Medical History:  Diagnosis Date   Allergic rhinitis    Anxiety    Chronic right shoulder pain    Depression    Hypercholesterolemia    Hypertension    Migraine    Obesity, morbid (HCC)    Sleep apnea    Type II diabetes mellitus, uncontrolled    Past Surgical History:  Past Surgical History:  Procedure Laterality Date   APPENDECTOMY     ROTATOR CUFF REPAIR Right    TUMOR REMOVAL     Social History:  reports that he has quit smoking. He has never used smokeless tobacco. He reports that he does not currently use alcohol. He reports that he does not currently use drugs. Family History:  Family History  Problem Relation Age of Onset   Diabetes Mother      HOME MEDICATIONS: Allergies as of 04/25/2023       Reactions   Invokana [canagliflozin]    Metformin And Related Diarrhea   Trulicity [dulaglutide]    Bydureon [exenatide] Rash   Tanzeum [albiglutide] Rash        Medication List        Accurate as of April 25, 2023 12:04 PM. If you have any questions, ask your nurse  or doctor.          BD Pen Needle Nano U/F 32G X 4 MM Misc Generic drug: Insulin Pen Needle USE TO INJECT TWICE A DAY   cetirizine 10 MG tablet Commonly known as: ZYRTEC Take 10 mg by mouth daily.   empagliflozin 25 MG Tabs tablet Commonly known as: Jardiance Take 1 tablet (25 mg total) by mouth daily before breakfast.   esomeprazole 20 MG capsule Commonly known as: NEXIUM Take 20 mg by mouth daily at 12 noon.   furosemide 40 MG tablet Commonly known as: LASIX   glimepiride 2 MG tablet Commonly known as: AMARYL Take 1 tablet (2 mg total) by mouth daily before breakfast.   methocarbamol 500 MG  tablet Commonly known as: ROBAXIN Take 1 tablet (500 mg total) by mouth every 8 (eight) hours as needed for muscle spasms.   Olmesartan-amLODIPine-HCTZ 40-10-25 MG Tabs daily.   OneTouch Verio test strip Generic drug: glucose blood USE TO CHECK BLOOD GLUCOSE TWICE   potassium chloride SA 20 MEQ tablet Commonly known as: KLOR-CON M   rosuvastatin 10 MG tablet Commonly known as: CRESTOR Take 10 mg by mouth daily.         OBJECTIVE:   Vital Signs: BP 130/73   Pulse 78   Ht 5\' 8"  (1.727 m)   Wt 284 lb 12.8 oz (129.2 kg)   SpO2 98%   BMI 43.30 kg/m   Wt Readings from Last 3 Encounters:  04/25/23 284 lb 12.8 oz (129.2 kg)  12/08/22 283 lb (128.4 kg)  07/14/22 279 lb (126.6 kg)     Exam: General: Pt appears well and is in NAD  Lungs: Clear with good BS bilat   Heart: RRR   Extremities: Trace  pretibial edema.   Neuro: MS is good with appropriate affect, pt is alert and Ox3    DM Foot Exam 04/25/2023  The skin of the feet is intact without sores or ulcerations. The pedal pulses are 1+ on right and 1+ on left.   DATA REVIEWED:  Lab Results  Component Value Date   HGBA1C 8.3 (A) 04/25/2023   HGBA1C 7.9 (A) 12/08/2022   HGBA1C 7.3 (A) 07/14/2022     01/24/2023 BUN 14 CR 0.98 GFR 85 HDL 35 LDL 101    Old records , labs and images have been reviewed.   ASSESSMENT / PLAN / RECOMMENDATIONS:   1) Type 2 Diabetes Mellitus,  Poorly controlled, With Neuropathic complications - Most recent A1c of 8.3 %. Goal A1c < 7.0 %.     -A1c continues to trend up -He has intolerance to several agents including Farxiga, Invokana, Trulicity, Ozempic and Rybelsus -He did well on Mounjaro but his insurance did not cover it, we opted to try a new prescription again today -I have recommended switching glimepiride to glipizide to be taken before breakfast and supper    MEDICATIONS: Stop glimepiride 2 mg, 1 tablet before Breakfast  Start glipizide 5 mg twice  daily Continue Jardiance 25 mg , 1 tablet every morning  Will attempt to prescribe Mounjaro 2.5 mg weekly   EDUCATION / INSTRUCTIONS: BG monitoring instructions: Patient is instructed to check his blood sugars 1 times a day, fasting . Call  Endocrinology clinic if: BG persistently < 70  I reviewed the Rule of 15 for the treatment of hypoglycemia in detail with the patient. Literature supplied.    2) Diabetic complications:  Eye: Does not have known diabetic retinopathy.  Neuro/ Feet: Does have known  diabetic peripheral neuropathy .  Renal: Patient does not have known baseline CKD. He   is  on an ACEI/ARB at present.   3) Dyslipidemia:  -Patient on statin therapy through his PCP, LDL at goal    F/U in 6 months     Signed electronically by: Lyndle Herrlich, MD  [facility] Endocrinology  [facility] [address] [place], [place] [address] Phone: [phone] FAX: [phone]   CC: Lupita Raider, MD [address] [place] [address] Phone: [phone]  Fax: [phone]  Return to Endocrinology clinic as below: No future appointments.

## 2023-04-26 DIAGNOSIS — Z7984 Long term (current) use of oral hypoglycemic drugs: Secondary | ICD-10-CM | POA: Insufficient documentation

## 2023-04-28 DIAGNOSIS — Z6841 Body Mass Index (BMI) 40.0 and over, adult: Secondary | ICD-10-CM | POA: Diagnosis not present

## 2023-04-28 DIAGNOSIS — B379 Candidiasis, unspecified: Secondary | ICD-10-CM | POA: Diagnosis not present

## 2023-04-28 DIAGNOSIS — T383X5A Adverse effect of insulin and oral hypoglycemic [antidiabetic] drugs, initial encounter: Secondary | ICD-10-CM | POA: Diagnosis not present

## 2023-07-29 DIAGNOSIS — E782 Mixed hyperlipidemia: Secondary | ICD-10-CM | POA: Diagnosis not present

## 2023-07-29 DIAGNOSIS — Z0000 Encounter for general adult medical examination without abnormal findings: Secondary | ICD-10-CM | POA: Diagnosis not present

## 2023-07-29 DIAGNOSIS — Z125 Encounter for screening for malignant neoplasm of prostate: Secondary | ICD-10-CM | POA: Diagnosis not present

## 2023-07-29 DIAGNOSIS — M1990 Unspecified osteoarthritis, unspecified site: Secondary | ICD-10-CM | POA: Diagnosis not present

## 2023-07-29 DIAGNOSIS — Z6841 Body Mass Index (BMI) 40.0 and over, adult: Secondary | ICD-10-CM | POA: Diagnosis not present

## 2023-07-29 DIAGNOSIS — Z1159 Encounter for screening for other viral diseases: Secondary | ICD-10-CM | POA: Diagnosis not present

## 2023-07-29 DIAGNOSIS — G4730 Sleep apnea, unspecified: Secondary | ICD-10-CM | POA: Diagnosis not present

## 2023-07-29 DIAGNOSIS — I10 Essential (primary) hypertension: Secondary | ICD-10-CM | POA: Diagnosis not present

## 2023-07-29 DIAGNOSIS — E119 Type 2 diabetes mellitus without complications: Secondary | ICD-10-CM | POA: Diagnosis not present

## 2023-07-29 DIAGNOSIS — Z1211 Encounter for screening for malignant neoplasm of colon: Secondary | ICD-10-CM | POA: Diagnosis not present

## 2023-08-08 DIAGNOSIS — R059 Cough, unspecified: Secondary | ICD-10-CM | POA: Diagnosis not present

## 2023-08-26 DIAGNOSIS — Z87891 Personal history of nicotine dependence: Secondary | ICD-10-CM | POA: Diagnosis not present

## 2023-08-26 DIAGNOSIS — Z136 Encounter for screening for cardiovascular disorders: Secondary | ICD-10-CM | POA: Diagnosis not present

## 2023-08-29 ENCOUNTER — Ambulatory Visit: Payer: Medicare PPO | Admitting: Internal Medicine

## 2023-08-29 ENCOUNTER — Encounter: Payer: Self-pay | Admitting: Internal Medicine

## 2023-08-29 VITALS — BP 122/74 | HR 79 | Ht 68.0 in | Wt 280.0 lb

## 2023-08-29 DIAGNOSIS — K5900 Constipation, unspecified: Secondary | ICD-10-CM | POA: Diagnosis not present

## 2023-08-29 DIAGNOSIS — E1142 Type 2 diabetes mellitus with diabetic polyneuropathy: Secondary | ICD-10-CM | POA: Diagnosis not present

## 2023-08-29 DIAGNOSIS — Z7985 Long-term (current) use of injectable non-insulin antidiabetic drugs: Secondary | ICD-10-CM

## 2023-08-29 DIAGNOSIS — E1165 Type 2 diabetes mellitus with hyperglycemia: Secondary | ICD-10-CM

## 2023-08-29 LAB — POCT GLYCOSYLATED HEMOGLOBIN (HGB A1C): Hemoglobin A1C: 7.2 % — AB (ref 4.0–5.6)

## 2023-08-29 MED ORDER — DEXCOM G7 SENSOR MISC: 1.0000 | 3 refills | Status: AC

## 2023-08-29 MED ORDER — TIRZEPATIDE 5 MG/0.5ML ~~LOC~~ SOAJ: 5.0000 mg | SUBCUTANEOUS | 3 refills | Status: DC

## 2023-08-29 MED ORDER — GLIPIZIDE 5 MG PO TABS: 5.0000 mg | ORAL_TABLET | Freq: Every day | ORAL | 3 refills | Status: DC

## 2023-08-29 NOTE — Progress Notes (Signed)
Name: Brian Sweeney  Age/ Sex: 68 y.o., male   MRN/ DOB: [id number], 11-22-1955     PCP: Lupita Raider, MD   Reason for Endocrinology Evaluation: Type 2 Diabetes Mellitus  Initial Endocrine Consultative Visit: 12/30/2017    PATIENT IDENTIFIER: Brian Sweeney is a 68 y.o. male with a past medical history of DM, OSA, Obesity, HTN and dyslipidemia . The patient has followed with Endocrinology clinic since 12/30/2017 for consultative assistance with management of his diabetes.  DIABETIC HISTORY:  Brian Sweeney was diagnosed with DM 2015, and started insulin therapy in 2018.  He w Chyrel Sweeney mouth as on Januvia at some point.  He is intolerant to metformin-diarrhea, Farxiga-diarrhea, Ozempic-nausea, tanzeum-pruritus and rash at the injection site, by durian-injection site redness, Rybelsus-nausea/vomiting , pioglitazone-edema. His hemoglobin A1c has ranged from 7.4% in 2019, peaking at 10.7% in 2021.  He was followed by Dr. Lanna Poche from 2019 until July 2022    He was started on [facility] through his PCPs office in December 2022  On his initial visit with me 07/2022, his A1c was 7.3%, he had Toujeo on his list that he had not taken for 6 months prior to his presentation as well as Greggory Keen that has not taken a month prior to his presentation, due to changes in insurance his co-pays were high.   I started him on glimepiride, Jardiance, and provided him with Mounjaro samples until he started new insurance  Greggory Keen was eventually not covered by his insurance 2024  Switch glimepiride to glipizide 04/2023 with an A1c of 8.3%   Mounjaro started 04/2023 SUBJECTIVE:   During the last visit (04/25/2023): A1c 8.3%     Today (08/29/2023): Brian Sweeney is here for diabetes management.  He checks his blood sugars 1-2 times daily. The patient has not had hypoglycemic episodes since the last clinic visit.   Denies nausea, or vomiting  Denies  diarrhea but has occasional constipation    HOME  DIABETES REGIMEN:  Glipizide 5 mg, twice daily Jardiance 25 mg , 1 tablet every morning  Mounjaro 2.5 mg daily    Statin: yes ACE-I/ARB: yes    CONTINUOUS GLUCOSE MONITORING RECORD INTERPRETATION    Dates of Recording: 1/14-1/27/2025  Sensor description:dexcom  Results statistics:   CGM use % of time 93  Average and SD 169/37  Time in range    67    %  % Time Above 180   % Time above 250   % Time Below target    Glycemic patterns summary: BG's mostly optimal at night   Hyperglycemic episodes  postprandial  Hypoglycemic episodes occurred overnight  Overnight periods: variable     DIABETIC COMPLICATIONS: Microvascular complications:  Neuropathy Denies: CKD , retinopathy  Last Eye Exam: Completed 2022  Macrovascular complications:   Denies: CAD, CVA, PVD   HISTORY:  Past Medical History:  Past Medical History:  Diagnosis Date   Allergic rhinitis    Anxiety    Chronic right shoulder pain    Depression    Hypercholesterolemia    Hypertension    Migraine    Obesity, morbid (HCC)    Sleep apnea    Type II diabetes mellitus, uncontrolled    Past Surgical History:  Past Surgical History:  Procedure Laterality Date   APPENDECTOMY     ROTATOR CUFF REPAIR Right    TUMOR REMOVAL     Social History:  reports that he has quit smoking. He has never used smokeless tobacco. He reports that he does  not currently use alcohol. He reports that he does not currently use drugs. Family History:  Family History  Problem Relation Age of Onset   Diabetes Mother      HOME MEDICATIONS: Allergies as of 08/29/2023       Reactions   Invokana [canagliflozin]    Metformin And Related Diarrhea   Trulicity [dulaglutide]    Bydureon [exenatide] Rash   Tanzeum [albiglutide] Rash        Medication List        Accurate as of August 29, 2023 11:46 AM. If you have any questions, ask your nurse or doctor.          STOP taking these medications     empagliflozin 25 MG Tabs tablet Commonly known as: Jardiance Stopped by: Brian Sweeney       TAKE these medications    BD Pen Needle Nano U/F 32G X 4 MM Misc Generic drug: Insulin Pen Needle USE TO INJECT TWICE A DAY   cetirizine 10 MG tablet Commonly known as: ZYRTEC Take 10 mg by mouth daily.   esomeprazole 20 MG capsule Commonly known as: NEXIUM Take 20 mg by mouth daily at 12 noon.   furosemide 40 MG tablet Commonly known as: LASIX   glipiZIDE 5 MG tablet Commonly known as: GLUCOTROL Take 1 tablet (5 mg total) by mouth 2 (two) times daily before a meal.   methocarbamol 500 MG tablet Commonly known as: ROBAXIN Take 1 tablet (500 mg total) by mouth every 8 (eight) hours as needed for muscle spasms.   Olmesartan-amLODIPine-HCTZ 40-10-25 MG Tabs daily.   OneTouch Verio test strip Generic drug: glucose blood USE TO CHECK BLOOD GLUCOSE TWICE   potassium chloride SA 20 MEQ tablet Commonly known as: KLOR-CON M   rosuvastatin 10 MG tablet Commonly known as: CRESTOR Take 10 mg by mouth daily.   tirzepatide 2.5 MG/0.5ML Pen Commonly known as: MOUNJARO Inject 2.5 mg into the skin once a week.         OBJECTIVE:   Vital Signs: BP 122/74 (BP Location: Right Arm, Patient Position: Sitting, Cuff Size: Normal)   Pulse 79   Ht 5\' 8"  (1.727 m)   Wt 280 lb (127 kg)   SpO2 99%   BMI 42.57 kg/m   Wt Readings from Last 3 Encounters:  08/29/23 280 lb (127 kg)  04/25/23 284 lb 12.8 oz (129.2 kg)  12/08/22 283 lb (128.4 kg)     Exam: General: Pt appears well and is in NAD  Lungs: Clear with good BS bilat   Heart: RRR   Extremities: No  pretibial edema.   Neuro: MS is good with appropriate affect, pt is alert and Ox3    DM Foot Exam 04/25/2023  The skin of the feet is intact without sores or ulcerations. The pedal pulses are 1+ on right and 1+ on left.   DATA REVIEWED:  Lab Results  Component Value Date   HGBA1C 7.2 (A) 08/29/2023   HGBA1C  8.3 (A) 04/25/2023   HGBA1C 7.9 (A) 12/08/2022     07/29/2023 Glucose 63 BUN 11 CR 0.84 GFR 96 HDL 25 LDL 34 TG 122    Old records , labs and images have been reviewed.   ASSESSMENT / PLAN / RECOMMENDATIONS:   1) Type 2 Diabetes Mellitus,  Optimally controlled, With Neuropathic complications - Most recent A1c of 7.2%. Goal A1c < 7.0 %.     -A1c has trended down -He has intolerance to several agents including  Docia Barrier, Trulicity, Ozempic and Rybelsus -Will decrease glipizide as below, due to fasting hypoglycemia -Will increase Mounjaro as below -A prescription for Dexcom G7 has been sent to his pharmacy per his request  MEDICATIONS:  Decrease glipizide 5 mg , 1 tab before Breakfast  Continue Jardiance 25 mg , 1 tablet every morning  Increase  Mounjaro 5 mg weekly   EDUCATION / INSTRUCTIONS: BG monitoring instructions: Patient is instructed to check his blood sugars 1 times a day, fasting . Call [place] [facility] clinic if: BG persistently < 70  I reviewed the Rule of 15 for the treatment of hypoglycemia in detail with the patient. Literature supplied.    2) Diabetic complications:  Eye: Does not have known diabetic retinopathy.  Neuro/ Feet: Does have known diabetic peripheral neuropathy .  Renal: Patient does not have known baseline CKD. He   is  on an ACEI/ARB at present.   3) Dyslipidemia:  -Patient on statin therapy through his PCP, LDL at goal  4) Constipation :  -This has been attributed to [facility] intake, I have encouraged fluid and fiber intake -He is currently using suppositories which does help  F/U in 6 months     Signed electronically by: Lyndle Herrlich, MD  [facility] Endocrinology  [facility] [address] [place], [place] [address] Phone: [phone] FAX: [phone]   CC: Lupita Raider, MD [address] [place] [address] Phone: [phone]  Fax:  [phone]  Return to Endocrinology clinic as below: No future appointments.

## 2023-08-29 NOTE — Patient Instructions (Signed)
Decrease Glipizide 5 mg, 1 tablet before breakfast Continue Jardiance 25 mg , 1 tablet every morning  Increase Mounjaro 5 mg once weekly       HOW TO TREAT LOW BLOOD SUGARS (Blood sugar LESS THAN 70 MG/DL) Please follow the RULE OF 15 for the treatment of hypoglycemia treatment (when your (blood sugars are less than 70 mg/dL)   STEP 1: Take 15 grams of carbohydrates when your blood sugar is low, which includes:  3-4 GLUCOSE TABS  OR 3-4 OZ OF JUICE OR REGULAR SODA OR ONE TUBE OF GLUCOSE GEL    STEP 2: RECHECK blood sugar in 15 MINUTES STEP 3: If your blood sugar is still low at the 15 minute recheck --> then, go back to STEP 1 and treat AGAIN with another 15 grams of carbohydrates.

## 2023-08-30 ENCOUNTER — Encounter: Payer: Self-pay | Admitting: Internal Medicine

## 2023-09-02 DIAGNOSIS — R945 Abnormal results of liver function studies: Secondary | ICD-10-CM | POA: Diagnosis not present

## 2023-10-20 ENCOUNTER — Other Ambulatory Visit (HOSPITAL_COMMUNITY): Payer: Self-pay

## 2023-10-20 ENCOUNTER — Other Ambulatory Visit: Payer: Self-pay

## 2023-10-20 ENCOUNTER — Telehealth: Payer: Self-pay

## 2023-10-20 DIAGNOSIS — E113599 Type 2 diabetes mellitus with proliferative diabetic retinopathy without macular edema, unspecified eye: Secondary | ICD-10-CM

## 2023-10-20 MED ORDER — ONETOUCH VERIO VI STRP: ORAL_STRIP | 2 refills | Status: AC

## 2023-10-20 NOTE — Telephone Encounter (Signed)
 Pharmacy Patient Advocate Encounter   Received notification from Pt Calls Messages that prior authorization for Dexcom G7 sensor is required/requested.   Insurance verification completed.   The patient is insured through [employer] .   Per test claim: PA required; PA submitted to above mentioned insurance via CoverMyMeds Key/confirmation #/EOC [id number] Status is pending

## 2023-10-20 NOTE — Telephone Encounter (Signed)
 Dexcom needs PA

## 2023-10-25 NOTE — Telephone Encounter (Signed)
 Approved through 08/01/2024

## 2023-10-28 DIAGNOSIS — R1031 Right lower quadrant pain: Secondary | ICD-10-CM | POA: Diagnosis not present

## 2023-10-28 DIAGNOSIS — R1012 Left upper quadrant pain: Secondary | ICD-10-CM | POA: Diagnosis not present

## 2023-10-28 DIAGNOSIS — R42 Dizziness and giddiness: Secondary | ICD-10-CM | POA: Diagnosis not present

## 2023-10-28 DIAGNOSIS — H547 Unspecified visual loss: Secondary | ICD-10-CM | POA: Diagnosis not present

## 2023-10-28 DIAGNOSIS — K529 Noninfective gastroenteritis and colitis, unspecified: Secondary | ICD-10-CM | POA: Diagnosis not present

## 2023-10-28 DIAGNOSIS — R1110 Vomiting, unspecified: Secondary | ICD-10-CM | POA: Diagnosis not present

## 2023-10-31 DIAGNOSIS — E119 Type 2 diabetes mellitus without complications: Secondary | ICD-10-CM | POA: Diagnosis not present

## 2023-10-31 DIAGNOSIS — H2511 Age-related nuclear cataract, right eye: Secondary | ICD-10-CM | POA: Diagnosis not present

## 2023-10-31 DIAGNOSIS — H43812 Vitreous degeneration, left eye: Secondary | ICD-10-CM | POA: Diagnosis not present

## 2023-11-02 ENCOUNTER — Other Ambulatory Visit: Payer: Self-pay

## 2023-11-02 ENCOUNTER — Encounter (HOSPITAL_COMMUNITY): Payer: Self-pay

## 2023-11-02 ENCOUNTER — Emergency Department (HOSPITAL_COMMUNITY)

## 2023-11-02 ENCOUNTER — Emergency Department (HOSPITAL_COMMUNITY)
Admission: EM | Admit: 2023-11-02 | Discharge: 2023-11-02 | Disposition: A | Discharge status: Home / Self Care | Attending: Emergency Medicine | Admitting: Emergency Medicine

## 2023-11-02 DIAGNOSIS — D72829 Elevated white blood cell count, unspecified: Secondary | ICD-10-CM | POA: Diagnosis not present

## 2023-11-02 DIAGNOSIS — E119 Type 2 diabetes mellitus without complications: Secondary | ICD-10-CM | POA: Insufficient documentation

## 2023-11-02 DIAGNOSIS — R112 Nausea with vomiting, unspecified: Secondary | ICD-10-CM | POA: Diagnosis present

## 2023-11-02 DIAGNOSIS — R109 Unspecified abdominal pain: Secondary | ICD-10-CM | POA: Diagnosis not present

## 2023-11-02 DIAGNOSIS — Z7984 Long term (current) use of oral hypoglycemic drugs: Secondary | ICD-10-CM | POA: Diagnosis not present

## 2023-11-02 DIAGNOSIS — K529 Noninfective gastroenteritis and colitis, unspecified: Secondary | ICD-10-CM | POA: Insufficient documentation

## 2023-11-02 DIAGNOSIS — K4020 Bilateral inguinal hernia, without obstruction or gangrene, not specified as recurrent: Secondary | ICD-10-CM | POA: Diagnosis not present

## 2023-11-02 DIAGNOSIS — N281 Cyst of kidney, acquired: Secondary | ICD-10-CM | POA: Diagnosis not present

## 2023-11-02 DIAGNOSIS — R1013 Epigastric pain: Secondary | ICD-10-CM | POA: Diagnosis not present

## 2023-11-02 LAB — CBG MONITORING, ED: Glucose-Capillary: 126 mg/dL — ABNORMAL HIGH (ref 70–99)

## 2023-11-02 LAB — URINALYSIS, ROUTINE W REFLEX MICROSCOPIC
Bilirubin Urine: NEGATIVE
Glucose, UA: NEGATIVE mg/dL
Hgb urine dipstick: NEGATIVE
Ketones, ur: NEGATIVE mg/dL
Leukocytes,Ua: NEGATIVE
Nitrite: NEGATIVE
Protein, ur: NEGATIVE mg/dL
Specific Gravity, Urine: 1.018 (ref 1.005–1.030)
pH: 6 (ref 5.0–8.0)

## 2023-11-02 LAB — COMPREHENSIVE METABOLIC PANEL WITH GFR
ALT: 25 U/L (ref 0–44)
AST: 29 U/L (ref 15–41)
Albumin: 3.5 g/dL (ref 3.5–5.0)
Alkaline Phosphatase: 76 U/L (ref 38–126)
Anion gap: 12 (ref 5–15)
BUN: 12 mg/dL (ref 8–23)
CO2: 29 mmol/L (ref 22–32)
Calcium: 10 mg/dL (ref 8.9–10.3)
Chloride: 96 mmol/L — ABNORMAL LOW (ref 98–111)
Creatinine, Ser: 1.05 mg/dL (ref 0.61–1.24)
GFR, Estimated: >60 mL/min (ref >60)
Glucose, Bld: 117 mg/dL — ABNORMAL HIGH (ref 70–99)
Potassium: 3.2 mmol/L — ABNORMAL LOW (ref 3.5–5.1)
Sodium: 137 mmol/L (ref 135–145)
Total Bilirubin: 0.8 mg/dL (ref 0.0–1.2)
Total Protein: 7.3 g/dL (ref 6.5–8.1)

## 2023-11-02 LAB — CBC WITH DIFFERENTIAL/PLATELET
Abs Immature Granulocytes: 0 10*3/uL (ref 0.00–0.07)
Basophils Absolute: 0 10*3/uL (ref 0.0–0.1)
Basophils Relative: 0 %
Eosinophils Absolute: 2 10*3/uL — ABNORMAL HIGH (ref 0.0–0.5)
Eosinophils Relative: 14 %
HCT: 44.3 % (ref 39.0–52.0)
Hemoglobin: 14.4 g/dL (ref 13.0–17.0)
Lymphocytes Relative: 10 %
Lymphs Abs: 1.4 10*3/uL (ref 0.7–4.0)
MCH: 26.4 pg (ref 26.0–34.0)
MCHC: 32.5 g/dL (ref 30.0–36.0)
MCV: 81.3 fL (ref 80.0–100.0)
Monocytes Absolute: 0.6 10*3/uL (ref 0.1–1.0)
Monocytes Relative: 4 %
Neutro Abs: 10.1 10*3/uL — ABNORMAL HIGH (ref 1.7–7.7)
Neutrophils Relative %: 72 %
Platelets: 255 10*3/uL (ref 150–400)
RBC: 5.45 MIL/uL (ref 4.22–5.81)
RDW: 16.4 % — ABNORMAL HIGH (ref 11.5–15.5)
WBC: 14 10*3/uL — ABNORMAL HIGH (ref 4.0–10.5)
nRBC: 0 % (ref 0.0–0.2)
nRBC: 0 /100{WBCs}

## 2023-11-02 LAB — LIPASE, BLOOD: Lipase: 27 U/L (ref 11–51)

## 2023-11-02 LAB — TROPONIN I (HIGH SENSITIVITY): Troponin I (High Sensitivity): 4 ng/L (ref <18)

## 2023-11-02 MED ORDER — ONDANSETRON HCL 4 MG/2ML IJ SOLN
4.0000 mg | Freq: Once | INTRAMUSCULAR | Status: AC
  Administered 2023-11-02: 4 mg via INTRAVENOUS
  Filled 2023-11-02: qty 2

## 2023-11-02 MED ORDER — SODIUM CHLORIDE 0.9 % IV BOLUS
1000.0000 mL | Freq: Once | INTRAVENOUS | Status: AC
  Administered 2023-11-02: 1000 mL via INTRAVENOUS

## 2023-11-02 MED ORDER — IOHEXOL 350 MG/ML SOLN
75.0000 mL | Freq: Once | INTRAVENOUS | Status: AC | PRN
  Administered 2023-11-02: 75 mL via INTRAVENOUS

## 2023-11-02 MED ORDER — POTASSIUM CHLORIDE 20 MEQ PO PACK
20.0000 meq | PACK | Freq: Two times a day (BID) | ORAL | Status: DC
  Administered 2023-11-02: 20 meq via ORAL
  Filled 2023-11-02: qty 1

## 2023-11-02 MED ORDER — ONDANSETRON 4 MG PO TBDP: 4.0000 mg | ORAL_TABLET | Freq: Three times a day (TID) | ORAL | 0 refills | Status: AC | PRN

## 2023-11-02 NOTE — ED Triage Notes (Signed)
 Pt. Stated, I had lunch yesterday and it came back up. Im having a lot of gas. No more vomiting since yesterday. I had 3 normal bowel movements. I had the same symptoms last week.

## 2023-11-02 NOTE — ED Provider Notes (Signed)
 [place] EMERGENCY DEPARTMENT AT [facility] Provider Note   CSN: [id number] Arrival date & time: 11/02/23  [id number]     History DM,IBS Chief Complaint  Patient presents with   Abdominal Pain   Emesis    Brian Sweeney is a 68 y.o. male.  68 y.o male with a PMH of diabetes, IBS D presents to the ED with a chief complaint of nausea, vomiting, diarrhea for the past week.  Patient reports he had similar symptoms about a week ago, was evaluated at the clinic and told he would need to be seen in the emergency department.  Patient did not seek care then.  Today he comes into the emergency department as he began to have some nausea yesterday, had 1 episode of vomiting this morning, he thought he might have thrown up his rice crispies that he had for lunch yesterday.  He last ate yesterday around breakfast time.  He has not tried any medication for improvement in his symptoms, last had water intake prior to arrival.  Does endorse multiple bouts of diarrhea, with no blood present.  Patient does endorse pain along the epigastric region however previously he felt more so the pain being generalized.  He thought this was likely his IBS symptoms as he does state "my nerves messed with my abdomen ".  No fever, no chills, prior surgical history of an appendectomy.   The history is provided by the patient and the EMS personnel.  Abdominal Pain Pain location:  Epigastric and suprapubic Pain quality: fullness   Pain radiates to:  Does not radiate Pain severity:  Moderate Onset quality:  Gradual Duration:  1 week Timing:  Intermittent Chronicity:  New Context: eating, previous surgery and sick contacts   Context: not alcohol use, not diet changes and not medication withdrawal   Relieved by:  Nothing Worsened by:  Eating Ineffective treatments:  None tried Associated symptoms: diarrhea, nausea and vomiting   Associated symptoms: no chest pain, no chills, no constipation, no fever, no shortness  of breath and no sore throat   Emesis Associated symptoms: abdominal pain and diarrhea   Associated symptoms: no chills, no fever and no sore throat        Home Medications Prior to Admission medications   Medication Sig Start Date End Date Taking? Authorizing Provider  ondansetron (ZOFRAN-ODT) 4 MG disintegrating tablet Take 1 tablet (4 mg total) by mouth every 8 (eight) hours as needed for up to 5 days for nausea or vomiting. 11/02/23 11/07/23 Yes Shacarra Choe, Leonie Douglas, PA-C  cetirizine (ZYRTEC) 10 MG tablet Take 10 mg by mouth daily.    [provider]  Continuous Glucose Sensor (DEXCOM G7 SENSOR) MISC 1 Device by Does not apply route as directed. 08/29/23   Shamleffer, Konrad Dolores, MD  esomeprazole (NEXIUM) 20 MG capsule Take 20 mg by mouth daily at 12 noon.    [provider]  furosemide (LASIX) 40 MG tablet  12/19/17   [provider]  glipiZIDE (GLUCOTROL) 5 MG tablet Take 1 tablet (5 mg total) by mouth daily before breakfast. 08/29/23   Shamleffer, Konrad Dolores, MD  glucose blood (ONETOUCH VERIO) test strip Use to check blood sugars 1-3 times daily 10/20/23   Shamleffer, Konrad Dolores, MD  methocarbamol (ROBAXIN) 500 MG tablet Take 1 tablet (500 mg total) by mouth every 8 (eight) hours as needed for muscle spasms. 03/18/21   Cammy Copa, MD  Olmesartan-amLODIPine-HCTZ 40-10-25 MG TABS daily. 10/01/14   [provider]  potassium chloride SA (K-DUR,KLOR-CON) 20 MEQ tablet  02/10/18   [provider]  rosuvastatin (CRESTOR) 10 MG tablet Take 10 mg by mouth daily.    [provider]  tirzepatide Greggory Keen) 5 MG/0.5ML Pen Inject 5 mg into the skin once a week. 08/29/23   Shamleffer, Konrad Dolores, MD  traZODone (DESYREL) 50 MG tablet Take 50-100 mg by mouth at bedtime as needed for sleep. 08/30/23   [provider]      Allergies    Invokana [canagliflozin], Metformin and related, Trulicity [dulaglutide], Bydureon [exenatide],  and Tanzeum [albiglutide]    Review of Systems   Review of Systems  Constitutional:  Negative for chills and fever.  HENT:  Negative for sore throat.   Respiratory:  Negative for shortness of breath.   Cardiovascular:  Negative for chest pain.  Gastrointestinal:  Positive for abdominal pain, diarrhea, nausea and vomiting. Negative for blood in stool, constipation and rectal pain.  Genitourinary:  Negative for flank pain.  Musculoskeletal:  Negative for back pain.  All other systems reviewed and are negative.   Physical Exam Updated Vital Signs BP 135/71   Pulse 66   Temp 97.9 F (36.6 C) (Oral)   Resp 15   Ht 5\' 8"  (1.727 m)   Wt 112 kg   SpO2 98%   BMI 37.56 kg/m  Physical Exam Vitals and nursing note reviewed.  Constitutional:      Appearance: He is well-developed.  HENT:     Head: Normocephalic and atraumatic.  Eyes:     General: No scleral icterus.    Pupils: Pupils are equal, round, and reactive to light.  Cardiovascular:     Heart sounds: Normal heart sounds.  Pulmonary:     Effort: Pulmonary effort is normal.     Breath sounds: Normal breath sounds. No wheezing.  Chest:     Chest wall: No tenderness.  Abdominal:     General: Bowel sounds are normal. There is no distension.     Palpations: Abdomen is soft.     Tenderness: There is abdominal tenderness in the epigastric area and suprapubic area. There is no guarding or rebound.  Musculoskeletal:        General: No tenderness or deformity.     Cervical back: Normal range of motion.  Skin:    General: Skin is warm and dry.  Neurological:     Mental Status: He is alert and oriented to person, place, and time.     ED Results / Procedures / Treatments   Labs (all labs ordered are listed, but only abnormal results are displayed) Labs Reviewed  CBC WITH DIFFERENTIAL/PLATELET - Abnormal; Notable for the following components:      Result Value   WBC 14.0 (*)    RDW 16.4 (*)    Neutro Abs 10.1 (*)     Eosinophils Absolute 2.0 (*)    All other components within normal limits  COMPREHENSIVE METABOLIC PANEL WITH GFR - Abnormal; Notable for the following components:   Potassium 3.2 (*)    Chloride 96 (*)    Glucose, Bld 117 (*)    All other components within normal limits  CBG MONITORING, ED - Abnormal; Notable for the following components:   Glucose-Capillary 126 (*)    All other components within normal limits  URINALYSIS, ROUTINE W REFLEX MICROSCOPIC  LIPASE, BLOOD  TROPONIN I (HIGH SENSITIVITY)    EKG None  Radiology CT ABDOMEN PELVIS W CONTRAST Result Date: 11/02/2023 CLINICAL DATA:  Abdominal pain,  acute, nonlocalized. EXAM: CT ABDOMEN AND PELVIS WITH CONTRAST TECHNIQUE: Multidetector CT imaging of the abdomen and pelvis was performed using the standard protocol following bolus administration of intravenous contrast. RADIATION DOSE REDUCTION: This exam was performed according to the departmental dose-optimization program which includes automated exposure control, adjustment of the mA and/or kV according to patient size and/or use of iterative reconstruction technique. CONTRAST:  75mL OMNIPAQUE IOHEXOL 350 MG/ML SOLN COMPARISON:  None Available. FINDINGS: Lower chest: The lung bases are clear. No pleural effusion. The heart is normal in size. No pericardial effusion. Hepatobiliary: The liver is normal in size. Non-cirrhotic configuration. No suspicious mass. These is mild diffuse hepatic steatosis. No intrahepatic or extrahepatic bile duct dilation. No calcified gallstones. Normal gallbladder wall thickness. No pericholecystic inflammatory changes. Pancreas: Unremarkable. No pancreatic ductal dilatation or surrounding inflammatory changes. Spleen: Within normal limits. No focal lesion. Adrenals/Urinary Tract: Adrenal glands are unremarkable. No suspicious renal mass. There are 2 simple cysts in the left kidney and 1 simple cyst in the right kidney with largest in the right kidney measuring 4.1  x 4.3 cm. No nephroureterolithiasis or obstructive uropathy. Unremarkable urinary bladder. Stomach/Bowel: No disproportionate dilation of the small or large bowel loops. No evidence of abnormal bowel wall thickening or inflammatory changes. The appendix was not visualized; however there is no acute inflammatory process in the right lower quadrant. Vascular/Lymphatic: No ascites or pneumoperitoneum. No abdominal or pelvic lymphadenopathy, by size criteria. No aneurysmal dilation of the major abdominal arteries. There are mild peripheral atherosclerotic vascular calcifications of the aorta and its major branches. Reproductive: Normal size prostate. Symmetric seminal vesicles. Other: There are fat containing umbilical and bilateral inguinal hernias. The soft tissues and abdominal wall are otherwise unremarkable. Musculoskeletal: There are 2 adjacent sclerotic foci in the left iliac bone with largest measuring 1.1 x 2.0 cm, incompletely characterized on the current exam but favored to represent bone islands. Correlate clinically to determine the need for additional imaging. There are mild multilevel degenerative changes in the visualized spine. IMPRESSION: 1. No acute inflammatory process identified within the abdomen or pelvis. 2. Two adjacent sclerotic foci in the left iliac bone, incompletely characterized on the current exam but favored to represent bone islands. Correlate clinically to determine the need for additional imaging. 3. Multiple other nonacute observations, as described above. Aortic Atherosclerosis (ICD10-I70.0). Electronically Signed   By: Jules Schick M.D.   On: 11/02/2023 13:51    Procedures Procedures    Medications Ordered in ED Medications  potassium chloride (KLOR-CON) packet 20 mEq (20 mEq Oral Given 11/02/23 1256)  ondansetron (ZOFRAN) injection 4 mg (4 mg Intravenous Given 11/02/23 1015)  sodium chloride 0.9 % bolus 1,000 mL (0 mLs Intravenous Stopped 11/02/23 1259)  iohexol  (OMNIPAQUE) 350 MG/ML injection 75 mL (75 mLs Intravenous Contrast Given 11/02/23 1225)    ED Course/ Medical Decision Making/ A&P                                 Medical Decision Making Amount and/or Complexity of Data Reviewed Labs: ordered. Radiology: ordered.  Risk Prescription drug management.    This patient presents to the ED for concern of nausea vomiting and diarrhea, this involves a number of treatment options, and is a complaint that carries with it a high risk of complications and morbidity.  The differential diagnosis includes partial obstruction, diverticulitis, cholelithiasis versus viral illness.   Co morbidities: Discussed in HPI   Brief  History:  See HPI  EMR reviewed including pt PMHx, past surgical history and past visits to ER.   See HPI for more details   Lab Tests:  I ordered and independently interpreted labs.  The pertinent results include:    I personally reviewed all laboratory work and imaging. Metabolic panel without any acute abnormality specifically kidney function within normal limits and no significant electrolyte abnormalities. CBC with slight leukocytosis or significant anemia. UA without any nitrites, no leukocytes.  Imaging Studies:  NAD. I personally reviewed all imaging studies and no acute abnormality found. I agree with radiology interpretation.   Cardiac Monitoring:  The patient was maintained on a cardiac monitor.  I personally viewed and interpreted the cardiac monitored which showed an underlying rhythm of: NSR  EKG non-ischemic   Medicines ordered:  I ordered medication including zofran  for nausea Reevaluation of the patient after these medicines showed that the patient improved I have reviewed the patients home medicines and have made adjustments as needed  Reevaluation:  After the interventions noted above I re-evaluated patient and found that they have :improved   Social Determinants of Health:  The  patient's social determinants of health were a factor in the care of this patient  Given results to daughter at the bedside.   Problem List / ED Course:  Patient here with underlying nausea, vomiting, diarrhea over the last several weeks.  Evaluated at the walk-in clinic and referred to the emergency department.  Reports this morning he had 2 episodes of not digested food that came up.  He is concerned for any type of obstruction.  He does have a history of IBS D where he experiences diarrhea, he reports "I feel that the symptoms come on with my nerves ".  He is hemodynamically stable, afebrile, only had some water this morning.  I discussed my plan and management with him and his daughter at the bedside. Interpretation of his labs by me reveal a CBC with a slight leukocytosis of 14, suspect this is reactive due to nausea and vomiting.  Abdomen is somewhat tender but there is no guarding or rebound noted.  CMP with no electrolyte derangement although he does have a low potassium suspect this is likely due to GI losses.  He was given oral potassium while in the emergency department and was able to tolerated.  UA does not have any signs of infection.  I discussed these results with him and daughter at the bedside.  His CT was benign, I do suspect this is likely a viral enteritis, UA does not have any signs of infection.  I discussed these results with him and daughter at the bedside.  His CT was benign, I do suspect this is likely a viral enteritis, daughter is seeking treatment with antibiotics, I did discuss with her risks over benefits.  Due to patient's elderly age, no blood present, non-malodorous, no recent antibiotic use I do not suspect that this needs treatment for ACDF component.  Patient is agreeable to plan and treatment, will follow-up with PCP as needed.  Return precautions discussed at length, patient stable for discharge.   Dispostion:  After consideration of the diagnostic results and the  patients response to treatment, I feel that the patent would benefit from outpatient follow up with PCP.    Portions of this note were generated with Scientist, clinical (histocompatibility and immunogenetics). Dictation errors may occur despite best attempts at proofreading.   Final Clinical Impression(s) / ED Diagnoses Final diagnoses:  Gastroenteritis  Rx / DC Orders ED Discharge Orders          Ordered    ondansetron (ZOFRAN-ODT) 4 MG disintegrating tablet  Every 8 hours PRN        11/02/23 1441              Claude Manges, PA-C 11/02/23 1447    Lonell Grandchild, MD 11/02/23 1749

## 2023-11-02 NOTE — Discharge Instructions (Addendum)
 You were given a new prescription for Zofran, please use this as directed to help with nausea.  If you experience any fever, worsening symptoms you will need to return to the emergency department.  Please follow-up with your primary care physician as needed.

## 2023-11-04 DIAGNOSIS — E1165 Type 2 diabetes mellitus with hyperglycemia: Secondary | ICD-10-CM | POA: Diagnosis not present

## 2023-11-04 DIAGNOSIS — J019 Acute sinusitis, unspecified: Secondary | ICD-10-CM | POA: Diagnosis not present

## 2023-11-04 DIAGNOSIS — R1110 Vomiting, unspecified: Secondary | ICD-10-CM | POA: Diagnosis not present

## 2023-11-21 ENCOUNTER — Other Ambulatory Visit: Payer: Self-pay | Admitting: Internal Medicine

## 2023-11-21 ENCOUNTER — Telehealth: Payer: Self-pay

## 2023-11-21 MED ORDER — GLIPIZIDE 5 MG PO TABS: 5.0000 mg | ORAL_TABLET | Freq: Two times a day (BID) | ORAL | 3 refills | Status: DC

## 2023-11-21 NOTE — Telephone Encounter (Signed)
 Brian Sweeney 4 weeks he has been having digestive issues. Went to ED they found nothing. Went to Dr Bernetta Brilliant and was taken off mounjaro  and told his food was not digesting. He had diarrhea and horrible gas. Just now getting back to normal but sugars are of course higher. He is asking for new med or a lower dose mounjaro . Please advise.

## 2023-11-21 NOTE — Telephone Encounter (Signed)
 Results have been relayed to the patient/authorized caretaker. The patient/authorized caretaker verbalized understanding. No questions at this time.

## 2023-12-05 DIAGNOSIS — G4733 Obstructive sleep apnea (adult) (pediatric): Secondary | ICD-10-CM | POA: Diagnosis not present

## 2024-01-25 ENCOUNTER — Telehealth: Payer: Self-pay

## 2024-01-27 DIAGNOSIS — M722 Plantar fascial fibromatosis: Secondary | ICD-10-CM | POA: Diagnosis not present

## 2024-01-27 DIAGNOSIS — R300 Dysuria: Secondary | ICD-10-CM | POA: Diagnosis not present

## 2024-01-27 DIAGNOSIS — E1169 Type 2 diabetes mellitus with other specified complication: Secondary | ICD-10-CM | POA: Diagnosis not present

## 2024-01-27 DIAGNOSIS — E782 Mixed hyperlipidemia: Secondary | ICD-10-CM | POA: Diagnosis not present

## 2024-01-27 DIAGNOSIS — I10 Essential (primary) hypertension: Secondary | ICD-10-CM | POA: Diagnosis not present

## 2024-02-15 DIAGNOSIS — E1169 Type 2 diabetes mellitus with other specified complication: Secondary | ICD-10-CM | POA: Diagnosis not present

## 2024-02-15 DIAGNOSIS — I10 Essential (primary) hypertension: Secondary | ICD-10-CM | POA: Diagnosis not present

## 2024-02-15 DIAGNOSIS — Z634 Disappearance and death of family member: Secondary | ICD-10-CM | POA: Diagnosis not present

## 2024-02-15 DIAGNOSIS — E559 Vitamin D deficiency, unspecified: Secondary | ICD-10-CM | POA: Diagnosis not present

## 2024-02-15 DIAGNOSIS — E1165 Type 2 diabetes mellitus with hyperglycemia: Secondary | ICD-10-CM | POA: Diagnosis not present

## 2024-02-15 DIAGNOSIS — R635 Abnormal weight gain: Secondary | ICD-10-CM | POA: Diagnosis not present

## 2024-02-15 DIAGNOSIS — G4730 Sleep apnea, unspecified: Secondary | ICD-10-CM | POA: Diagnosis not present

## 2024-02-15 DIAGNOSIS — R252 Cramp and spasm: Secondary | ICD-10-CM | POA: Diagnosis not present

## 2024-02-15 NOTE — Telephone Encounter (Signed)
 Done

## 2024-02-21 DIAGNOSIS — H25011 Cortical age-related cataract, right eye: Secondary | ICD-10-CM | POA: Diagnosis not present

## 2024-02-21 DIAGNOSIS — Z961 Presence of intraocular lens: Secondary | ICD-10-CM | POA: Diagnosis not present

## 2024-02-21 DIAGNOSIS — H2511 Age-related nuclear cataract, right eye: Secondary | ICD-10-CM | POA: Diagnosis not present

## 2024-02-21 DIAGNOSIS — H18413 Arcus senilis, bilateral: Secondary | ICD-10-CM | POA: Diagnosis not present

## 2024-02-27 ENCOUNTER — Ambulatory Visit: Payer: Medicare PPO | Admitting: Internal Medicine

## 2024-03-07 DIAGNOSIS — G4733 Obstructive sleep apnea (adult) (pediatric): Secondary | ICD-10-CM | POA: Diagnosis not present

## 2024-03-14 DIAGNOSIS — G4733 Obstructive sleep apnea (adult) (pediatric): Secondary | ICD-10-CM | POA: Diagnosis not present

## 2024-05-07 DIAGNOSIS — H25041 Posterior subcapsular polar age-related cataract, right eye: Secondary | ICD-10-CM | POA: Diagnosis not present

## 2024-05-07 DIAGNOSIS — H25011 Cortical age-related cataract, right eye: Secondary | ICD-10-CM | POA: Diagnosis not present

## 2024-05-07 DIAGNOSIS — H2511 Age-related nuclear cataract, right eye: Secondary | ICD-10-CM | POA: Diagnosis not present

## 2024-05-14 DIAGNOSIS — G4733 Obstructive sleep apnea (adult) (pediatric): Secondary | ICD-10-CM | POA: Diagnosis not present

## 2024-05-14 DIAGNOSIS — I10 Essential (primary) hypertension: Secondary | ICD-10-CM | POA: Diagnosis not present

## 2024-05-14 DIAGNOSIS — E669 Obesity, unspecified: Secondary | ICD-10-CM | POA: Diagnosis not present

## 2024-05-18 DIAGNOSIS — I10 Essential (primary) hypertension: Secondary | ICD-10-CM | POA: Diagnosis not present

## 2024-05-18 DIAGNOSIS — E1169 Type 2 diabetes mellitus with other specified complication: Secondary | ICD-10-CM | POA: Diagnosis not present

## 2024-05-18 DIAGNOSIS — E1165 Type 2 diabetes mellitus with hyperglycemia: Secondary | ICD-10-CM | POA: Diagnosis not present

## 2024-08-25 ENCOUNTER — Other Ambulatory Visit: Payer: Self-pay | Admitting: Internal Medicine
